# Patient Record
Sex: Female | Born: 1975 | Hispanic: Yes | Marital: Single | Smoking: Current every day smoker
Health system: Southern US, Community
[De-identification: ages and names within clinical notes are randomized; demographics above are authoritative.]

## PROBLEM LIST (undated history)

## (undated) ENCOUNTER — Inpatient Hospital Stay (HOSPITAL_COMMUNITY): Payer: Self-pay

## (undated) DIAGNOSIS — Z789 Other specified health status: Secondary | ICD-10-CM

## (undated) DIAGNOSIS — O24419 Gestational diabetes mellitus in pregnancy, unspecified control: Secondary | ICD-10-CM

## (undated) HISTORY — PX: NO PAST SURGERIES: SHX2092

## (undated) HISTORY — DX: Gestational diabetes mellitus in pregnancy, unspecified control: O24.419

---
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
## 1998-01-17 ENCOUNTER — Other Ambulatory Visit: Admission: RE | Admit: 1998-01-17 | Discharge: 1998-01-17 | Payer: Self-pay | Admitting: Family Medicine

## 1998-06-19 ENCOUNTER — Other Ambulatory Visit: Admission: RE | Admit: 1998-06-19 | Discharge: 1998-06-19 | Payer: Self-pay | Admitting: Obstetrics

## 1998-06-28 ENCOUNTER — Ambulatory Visit (HOSPITAL_COMMUNITY): Admission: RE | Admit: 1998-06-28 | Discharge: 1998-06-28 | Payer: Self-pay | Admitting: Obstetrics

## 1998-12-31 ENCOUNTER — Inpatient Hospital Stay (HOSPITAL_COMMUNITY): Admission: AD | Admit: 1998-12-31 | Discharge: 1998-12-31 | Payer: Self-pay | Admitting: *Deleted

## 1999-01-01 ENCOUNTER — Inpatient Hospital Stay (HOSPITAL_COMMUNITY): Admission: AD | Admit: 1999-01-01 | Discharge: 1999-01-02 | Payer: Self-pay | Admitting: *Deleted

## 2004-08-26 ENCOUNTER — Inpatient Hospital Stay (HOSPITAL_COMMUNITY): Admission: AD | Admit: 2004-08-26 | Discharge: 2004-08-28 | Payer: Self-pay | Admitting: *Deleted

## 2004-08-26 ENCOUNTER — Ambulatory Visit: Payer: Self-pay | Admitting: *Deleted

## 2004-10-29 ENCOUNTER — Other Ambulatory Visit: Admission: RE | Admit: 2004-10-29 | Discharge: 2004-10-29 | Payer: Self-pay | Admitting: Internal Medicine

## 2004-10-29 ENCOUNTER — Ambulatory Visit: Payer: Self-pay | Admitting: Internal Medicine

## 2005-10-13 ENCOUNTER — Other Ambulatory Visit: Admission: RE | Admit: 2005-10-13 | Discharge: 2005-10-13 | Payer: Self-pay | Admitting: Internal Medicine

## 2005-10-13 ENCOUNTER — Encounter: Payer: Self-pay | Admitting: Internal Medicine

## 2005-10-13 ENCOUNTER — Ambulatory Visit: Payer: Self-pay | Admitting: Internal Medicine

## 2008-07-04 ENCOUNTER — Emergency Department (HOSPITAL_COMMUNITY): Admission: EM | Admit: 2008-07-04 | Discharge: 2008-07-05 | Payer: Self-pay | Admitting: Emergency Medicine

## 2009-05-06 ENCOUNTER — Emergency Department (HOSPITAL_COMMUNITY): Admission: EM | Admit: 2009-05-06 | Discharge: 2009-05-06 | Payer: Self-pay | Admitting: Emergency Medicine

## 2009-05-08 ENCOUNTER — Emergency Department (HOSPITAL_COMMUNITY): Admission: EM | Admit: 2009-05-08 | Discharge: 2009-05-08 | Payer: Self-pay | Admitting: Emergency Medicine

## 2009-05-11 ENCOUNTER — Emergency Department (HOSPITAL_COMMUNITY): Admission: EM | Admit: 2009-05-11 | Discharge: 2009-05-11 | Payer: Self-pay | Admitting: Emergency Medicine

## 2009-09-24 ENCOUNTER — Ambulatory Visit (HOSPITAL_COMMUNITY): Admission: RE | Admit: 2009-09-24 | Discharge: 2009-09-24 | Payer: Self-pay | Admitting: Family Medicine

## 2009-09-24 ENCOUNTER — Encounter: Payer: Self-pay | Admitting: Family Medicine

## 2009-10-22 ENCOUNTER — Ambulatory Visit (HOSPITAL_COMMUNITY): Admission: RE | Admit: 2009-10-22 | Discharge: 2009-10-22 | Payer: Self-pay | Admitting: Family Medicine

## 2009-10-22 ENCOUNTER — Encounter: Payer: Self-pay | Admitting: Family Medicine

## 2009-10-29 ENCOUNTER — Ambulatory Visit (HOSPITAL_COMMUNITY): Admission: RE | Admit: 2009-10-29 | Discharge: 2009-10-29 | Payer: Self-pay | Admitting: Family Medicine

## 2009-10-29 ENCOUNTER — Encounter: Payer: Self-pay | Admitting: Family Medicine

## 2009-11-05 ENCOUNTER — Ambulatory Visit (HOSPITAL_COMMUNITY): Admission: RE | Admit: 2009-11-05 | Discharge: 2009-11-05 | Payer: Self-pay | Admitting: Family Medicine

## 2009-11-05 ENCOUNTER — Encounter: Payer: Self-pay | Admitting: Family Medicine

## 2009-11-13 ENCOUNTER — Ambulatory Visit (HOSPITAL_COMMUNITY): Admission: RE | Admit: 2009-11-13 | Discharge: 2009-11-13 | Payer: Self-pay | Admitting: Family Medicine

## 2009-11-13 ENCOUNTER — Encounter: Payer: Self-pay | Admitting: Family Medicine

## 2009-11-20 ENCOUNTER — Ambulatory Visit (HOSPITAL_COMMUNITY): Admission: RE | Admit: 2009-11-20 | Discharge: 2009-11-20 | Payer: Self-pay | Admitting: Family Medicine

## 2009-11-21 ENCOUNTER — Ambulatory Visit: Payer: Self-pay | Admitting: Obstetrics & Gynecology

## 2009-11-25 ENCOUNTER — Ambulatory Visit (HOSPITAL_COMMUNITY): Admission: RE | Admit: 2009-11-25 | Discharge: 2009-11-25 | Payer: Self-pay | Admitting: Family Medicine

## 2009-11-25 ENCOUNTER — Encounter: Payer: Self-pay | Admitting: Family Medicine

## 2009-11-25 ENCOUNTER — Ambulatory Visit: Payer: Self-pay | Admitting: Obstetrics & Gynecology

## 2009-11-25 ENCOUNTER — Inpatient Hospital Stay (HOSPITAL_COMMUNITY): Admission: AD | Admit: 2009-11-25 | Discharge: 2009-11-25 | Payer: Self-pay | Admitting: Obstetrics & Gynecology

## 2009-11-28 ENCOUNTER — Ambulatory Visit: Payer: Self-pay | Admitting: Obstetrics & Gynecology

## 2009-12-03 ENCOUNTER — Encounter: Payer: Self-pay | Admitting: Family Medicine

## 2009-12-03 ENCOUNTER — Ambulatory Visit (HOSPITAL_COMMUNITY): Admission: RE | Admit: 2009-12-03 | Discharge: 2009-12-03 | Payer: Self-pay | Admitting: Family Medicine

## 2009-12-05 ENCOUNTER — Ambulatory Visit: Payer: Self-pay | Admitting: Obstetrics & Gynecology

## 2009-12-10 ENCOUNTER — Ambulatory Visit (HOSPITAL_COMMUNITY): Admission: RE | Admit: 2009-12-10 | Discharge: 2009-12-10 | Payer: Self-pay | Admitting: Family Medicine

## 2009-12-10 ENCOUNTER — Encounter: Payer: Self-pay | Admitting: Family Medicine

## 2009-12-12 ENCOUNTER — Ambulatory Visit: Payer: Self-pay | Admitting: Obstetrics & Gynecology

## 2009-12-16 ENCOUNTER — Ambulatory Visit (HOSPITAL_COMMUNITY): Admission: RE | Admit: 2009-12-16 | Discharge: 2009-12-16 | Payer: Self-pay | Admitting: Family Medicine

## 2009-12-19 ENCOUNTER — Ambulatory Visit: Payer: Self-pay | Admitting: Family Medicine

## 2009-12-19 ENCOUNTER — Encounter (INDEPENDENT_AMBULATORY_CARE_PROVIDER_SITE_OTHER): Payer: Self-pay | Admitting: *Deleted

## 2009-12-22 ENCOUNTER — Encounter: Payer: Self-pay | Admitting: Family Medicine

## 2009-12-22 ENCOUNTER — Ambulatory Visit: Payer: Self-pay | Admitting: Obstetrics and Gynecology

## 2009-12-22 ENCOUNTER — Inpatient Hospital Stay (HOSPITAL_COMMUNITY): Admission: AD | Admit: 2009-12-22 | Discharge: 2009-12-24 | Payer: Self-pay | Admitting: Family Medicine

## 2010-01-15 ENCOUNTER — Encounter: Payer: Self-pay | Admitting: Family Medicine

## 2010-01-15 ENCOUNTER — Ambulatory Visit: Payer: Self-pay | Admitting: Family Medicine

## 2010-01-15 LAB — CONVERTED CEMR LAB: Beta hcg, urine, semiquantitative: NEGATIVE

## 2010-08-17 ENCOUNTER — Encounter: Payer: Self-pay | Admitting: Family Medicine

## 2010-08-28 NOTE — Assessment & Plan Note (Signed)
Summary: PPD ck/Implanon imp,tcb   Vital Signs:  Patient profile:   35 year old female Weight:      176 pounds Temp:     98.6 degrees F Pulse rate:   84 / minute BP sitting:   121 / 76  Vitals Entered By: Jone Baseman CMA (January 15, 2010 2:07 PM) CC: Implanon Is Patient Diabetic? No Pain Assessment Patient in pain? no        CC:  Implanon.  History of Present IllnessChristiana Vincent, [id number], s/p VD on 12-22-09.  Pt requested Implanon prior to deliery of VMI on 12-22-09.  Pt denies intercorse since delivery, no bleeding currently. Pt denies every having had serious blood clots, a heart attack or stroke. never had hepatic tumors or liver disease, no hx of undiagnosed abnormal genital bleeding, no hx of suspected breast cancer or a history of breast cancer.  Pt is not breast-feeding.   No allergies.     Habits & Providers  Alcohol-Tobacco-Diet     Alcohol drinks/day: 0     Tobacco Status: quit     Tobacco Counseling: to quit use of tobacco products     Cigarette Packs/Day: <0.25  Exercise-Depression-Behavior     Does Patient Exercise: yes     Drug Use: no  Allergies (verified): No Known Drug Allergies  Past History:  Past Medical History: Unremarkable  Past Surgical History: Denies surgical history  Family History: Reviewed history and no changes required.  Social History: Married Former Smoker Alcohol use-no Drug use-no Regular exercise-yes Smoking Status:  quit Packs/Day:  <0.25 Drug Use:  no Does Patient Exercise:  yes  Physical Exam  General:  VS reviewed, alert, well-developed, and well-nourished.   Lungs:  Normal respiratory effort, chest expands symmetrically. Lungs are clear to auscultation, no crackles or wheezes. Heart:  Normal rate and regular rhythm. S1 and S2 normal without gallop, murmur, click, rub or other extra sounds. Abdomen:  Bowel sounds positive,abdomen soft and non-tender without masses, organomegaly or hernias noted. Additional  Exam:  Procedure Note: Pt supine with Left arm exposed and supported by the table. Inspected upper inner aspect of her left (non dominant) arm , insertion site identified and marked (approximately three fingerbreadths superior and lateral to the medial epicondyle of the humerus).  Sterile drape under the arm and insertion site cleaned with iodine swabs x 2. 25-gauge needle attached to 5 mL syringe was used to administer 5 cc's of  1% lidocaine which was injected into the dermis to raise a wheal along the planned track of the rod insertion needle  Confirmed that the rod was in the needle and Implanon was inserted, bevel up. Advanced needle in subdermal connective tissue while tenting up the skin to ensure proper placement. Needle was advanced to its full length, seal was broken on the applicator, turned obturator 90 degrees. Slowly removed the cannula and needle out of  arm, rod under the skin.   Skin was palpated and both ends of rod were palpable, verified correct placement of the rod; Examine the needle, obturator visible, rod not present. Showed  patient how to palpate the rod,  then placed an adhesive closure and bandage over the insertion site. Patient Chart Label:  Lot # [id number]. Insertion date: 01-15-10; 3 year removal date: 01-15-2013. LEFT arm. Implanon was successfully placed. User Card filled out and  given to the patient.  Pt tolerated procedure well and had no complications. Advised to call if she develops pain, discharge, or swelling at the  insertion site, or fever.     Impression & Recommendations:  Problem # 1:  CONTRACEPTIVE MANAGEMENT (ICD-V25.09) Assessment Unchanged U preg negative.  Reviewed risks of procedures with pt.  Discussed possible side effects including, but not limited to, irregular/unpredicitable peroids, mood swings, headaches, weight gain, acne.  Cosent form reviwed and signed. Dr. Jennette Kettle supervised.   Pt Tolerated procedure well, no complications.  Advised no  intercourse x 7 days.   See procedure note above for full details.   Complete Medication List: 1)  Prenatal/folic Acid Tabs (Prenatal vit-fe fumarate-fa)  Other Orders: U Preg-[facility] (16109) Insertion, non biodegradable drug deliver implant, (60454) Etonogestrel implant system, including implant and supplie (U9811) Postpartum visit- [facility] ([id number])  Patient Instructions: 1)  Your Implanon was placed in your left arm today. 2)  Please call me if you have any pain, swelling or fevers. 3)  You can have intercourse in 7 days. 4)  Implanon does not protect you from STD's. 5)  Keep your Implanon card in a safe place.   Laboratory Results   Urine Tests  Date/Time Received: January 15, 2010 2:10 PM  Date/Time Reported: January 15, 2010 2:15 PM     Urine HCG: negative Comments: ...........test performed by...........Marland KitchenTerese Door, CMA

## 2010-08-28 NOTE — Miscellaneous (Signed)
Summary: Consent Implanon Insertion  Consent Implanon Insertion   Imported By: Clydell Hakim 01/23/2010 12:11:54  _____________________________________________________________________  External Attachment:    Type:   Image     Comment:   External Document

## 2010-10-13 LAB — POCT URINALYSIS DIP (DEVICE)
Bilirubin Urine: NEGATIVE
Glucose, UA: NEGATIVE mg/dL
Glucose, UA: NEGATIVE mg/dL
Hgb urine dipstick: NEGATIVE
Ketones, ur: NEGATIVE mg/dL
Nitrite: NEGATIVE
Protein, ur: NEGATIVE mg/dL
Protein, ur: NEGATIVE mg/dL
Specific Gravity, Urine: >=1.03 (ref 1.005–1.030)
Urobilinogen, UA: 0.2 mg/dL (ref 0.0–1.0)
Urobilinogen, UA: 0.2 mg/dL (ref 0.0–1.0)
pH: 6.5 (ref 5.0–8.0)

## 2010-10-13 LAB — RPR: RPR Ser Ql: NONREACTIVE

## 2010-10-13 LAB — CBC
HCT: 36.6 % (ref 36.0–46.0)
MCHC: 34.7 g/dL (ref 30.0–36.0)
MCV: 91.6 fL (ref 78.0–100.0)
MCV: 92.1 fL (ref 78.0–100.0)
Platelets: 242 10*3/uL (ref 150–400)
RBC: 4 MIL/uL (ref 3.87–5.11)
RDW: 13.5 % (ref 11.5–15.5)
RDW: 13.8 % (ref 11.5–15.5)
WBC: 10.8 10*3/uL — ABNORMAL HIGH (ref 4.0–10.5)

## 2010-10-14 LAB — POCT URINALYSIS DIP (DEVICE)
Bilirubin Urine: NEGATIVE
Glucose, UA: NEGATIVE mg/dL
Glucose, UA: NEGATIVE mg/dL
Hgb urine dipstick: NEGATIVE
Ketones, ur: NEGATIVE mg/dL
Ketones, ur: NEGATIVE mg/dL
Protein, ur: NEGATIVE mg/dL
Specific Gravity, Urine: 1.015 (ref 1.005–1.030)
Urobilinogen, UA: 0.2 mg/dL (ref 0.0–1.0)
Urobilinogen, UA: 0.2 mg/dL (ref 0.0–1.0)
Urobilinogen, UA: 0.2 mg/dL (ref 0.0–1.0)
pH: 6.5 (ref 5.0–8.0)

## 2011-05-01 LAB — CULTURE, ROUTINE-ABSCESS

## 2011-05-01 LAB — POCT PREGNANCY, URINE: Preg Test, Ur: NEGATIVE

## 2011-05-18 ENCOUNTER — Emergency Department (HOSPITAL_COMMUNITY)
Admission: EM | Admit: 2011-05-18 | Discharge: 2011-05-19 | Disposition: A | Discharge status: Home / Self Care | Payer: Self-pay | Attending: Emergency Medicine | Admitting: Emergency Medicine

## 2011-05-18 DIAGNOSIS — R109 Unspecified abdominal pain: Secondary | ICD-10-CM | POA: Insufficient documentation

## 2011-05-18 DIAGNOSIS — R10815 Periumbilic abdominal tenderness: Secondary | ICD-10-CM | POA: Insufficient documentation

## 2011-05-18 DIAGNOSIS — R112 Nausea with vomiting, unspecified: Secondary | ICD-10-CM | POA: Insufficient documentation

## 2011-05-18 LAB — URINALYSIS, ROUTINE W REFLEX MICROSCOPIC
Bilirubin Urine: NEGATIVE
Hgb urine dipstick: NEGATIVE
Ketones, ur: NEGATIVE mg/dL
Nitrite: NEGATIVE
Urobilinogen, UA: 1 mg/dL (ref 0.0–1.0)

## 2011-05-18 LAB — BASIC METABOLIC PANEL
CO2: 22 mEq/L (ref 19–32)
Calcium: 9.2 mg/dL (ref 8.4–10.5)
Creatinine, Ser: 0.5 mg/dL (ref 0.50–1.10)
GFR calc non Af Amer: >90 mL/min (ref >90)
Sodium: 139 mEq/L (ref 135–145)

## 2011-05-18 LAB — DIFFERENTIAL
Basophils Absolute: 0 10*3/uL (ref 0.0–0.1)
Lymphocytes Relative: 25 % (ref 12–46)
Lymphs Abs: 3.5 10*3/uL (ref 0.7–4.0)
Neutro Abs: 9.4 10*3/uL — ABNORMAL HIGH (ref 1.7–7.7)

## 2011-05-18 LAB — CBC
Hemoglobin: 14 g/dL (ref 12.0–15.0)
MCV: 89.9 fL (ref 78.0–100.0)
Platelets: 278 10*3/uL (ref 150–400)
WBC: 14 10*3/uL — ABNORMAL HIGH (ref 4.0–10.5)

## 2011-05-18 LAB — POCT PREGNANCY, URINE: Preg Test, Ur: NEGATIVE

## 2011-05-19 ENCOUNTER — Emergency Department (HOSPITAL_COMMUNITY): Payer: Self-pay

## 2011-05-19 LAB — HEPATIC FUNCTION PANEL
Alkaline Phosphatase: 64 U/L (ref 39–117)
Bilirubin, Direct: 0.1 mg/dL (ref 0.0–0.3)
Total Bilirubin: 0.9 mg/dL (ref 0.3–1.2)

## 2011-05-19 LAB — LIPASE, BLOOD: Lipase: 17 U/L (ref 11–59)

## 2011-05-19 MED ORDER — IOHEXOL 300 MG/ML  SOLN
80.0000 mL | Freq: Once | INTRAMUSCULAR | Status: AC | PRN
  Administered 2011-05-19: 80 mL via INTRAVENOUS

## 2011-06-01 ENCOUNTER — Emergency Department (HOSPITAL_COMMUNITY)
Admission: EM | Admit: 2011-06-01 | Discharge: 2011-06-01 | Discharge status: Against Medical Advice | Payer: Self-pay | Attending: Emergency Medicine | Admitting: Emergency Medicine

## 2011-06-01 ENCOUNTER — Encounter: Payer: Self-pay | Admitting: *Deleted

## 2011-06-01 DIAGNOSIS — K089 Disorder of teeth and supporting structures, unspecified: Secondary | ICD-10-CM | POA: Insufficient documentation

## 2011-06-01 NOTE — ED Notes (Signed)
Toothache since this afternoon

## 2011-11-13 ENCOUNTER — Encounter (HOSPITAL_COMMUNITY): Payer: Self-pay | Admitting: *Deleted

## 2011-11-13 ENCOUNTER — Emergency Department (HOSPITAL_COMMUNITY)
Admission: EM | Admit: 2011-11-13 | Discharge: 2011-11-13 | Disposition: A | Discharge status: Home / Self Care | Payer: Self-pay | Attending: Emergency Medicine | Admitting: Emergency Medicine

## 2011-11-13 DIAGNOSIS — K0889 Other specified disorders of teeth and supporting structures: Secondary | ICD-10-CM

## 2011-11-13 DIAGNOSIS — F17200 Nicotine dependence, unspecified, uncomplicated: Secondary | ICD-10-CM | POA: Insufficient documentation

## 2011-11-13 DIAGNOSIS — K089 Disorder of teeth and supporting structures, unspecified: Secondary | ICD-10-CM | POA: Insufficient documentation

## 2011-11-13 LAB — SALICYLATE LEVEL: Salicylate Lvl: 10.2 mg/dL (ref 2.8–20.0)

## 2011-11-13 MED ORDER — NAPROXEN 500 MG PO TABS: 500.0000 mg | ORAL_TABLET | Freq: Two times a day (BID) | ORAL | Status: DC

## 2011-11-13 MED ORDER — MORPHINE SULFATE 4 MG/ML IJ SOLN: 4.0000 mg | Freq: Once | INTRAMUSCULAR | Status: DC

## 2011-11-13 MED ORDER — MORPHINE SULFATE 4 MG/ML IJ SOLN
4.0000 mg | Freq: Once | INTRAMUSCULAR | Status: AC
  Administered 2011-11-13: 4 mg via INTRAMUSCULAR
  Filled 2011-11-13: qty 1

## 2011-11-13 MED ORDER — PENICILLIN V POTASSIUM 500 MG PO TABS: 500.0000 mg | ORAL_TABLET | Freq: Four times a day (QID) | ORAL | Status: AC

## 2011-11-13 MED ORDER — HYDROCODONE-ACETAMINOPHEN 5-500 MG PO TABS
1.0000 | ORAL_TABLET | Freq: Four times a day (QID) | ORAL | Status: AC | PRN
Start: 2011-11-13 — End: 2011-11-23

## 2011-11-13 NOTE — ED Notes (Signed)
Pt reports a filling on bottom left molar came out approx 2 weeks ago, also reports pain to rt upper molar

## 2011-11-13 NOTE — Discharge Instructions (Signed)
Dental Pain A tooth ache may be caused by cavities (tooth decay). Cavities expose the nerve of the tooth to air and hot or cold temperatures. It may come from an infection or abscess (also called a boil or furuncle) around your tooth. It is also often caused by dental caries (tooth decay). This causes the pain you are having. DIAGNOSIS  Your caregiver can diagnose this problem by exam. TREATMENT   If caused by an infection, it may be treated with medications which kill germs (antibiotics) and pain medications as prescribed by your caregiver. Take medications as directed.   Only take over-the-counter or prescription medicines for pain, discomfort, or fever as directed by your caregiver.   Whether the tooth ache today is caused by infection or dental disease, you should see your dentist as soon as possible for further care.  SEEK MEDICAL CARE IF: The exam and treatment you received today has been provided on an emergency basis only. This is not a substitute for complete medical or dental care. If your problem worsens or new problems (symptoms) appear, and you are unable to meet with your dentist, call or return to this location. SEEK IMMEDIATE MEDICAL CARE IF:   You have a fever.   You develop redness and swelling of your face, jaw, or neck.   You are unable to open your mouth.   You have severe pain uncontrolled by pain medicine.  MAKE SURE YOU:   Understand these instructions.   Will watch your condition.   Will get help right away if you are not doing well or get worse.  Document Released: 07/13/2005 Document Revised: 07/02/2011 Document Reviewed: 02/29/2008 ExitCare Patient Information 2012 [employer]  RESOURCE GUIDE  Dental Problems  Patients with Medicaid: [facility]                     [facility] [address]                                           [address] Phone:  [phone]                                                   Phone:  [phone]  If unable to pay or uninsured, contact:  [facility] or [facility] to become qualified for the adult dental clinic.  Chronic Pain Problems Contact [facility]  [phone] Patients need to be referred by their primary care doctor.  Insufficient Money for Medicine Contact [employer]:  call "211" or [facility] 271-5999.  No Primary Care Doctor Call [facility]  832-8000 Other agencies that provide inexpensive medical care    [facility]  [phone]    [facility]  [phone]    [facility]  [phone]    [facility] Clinic  [phone]    [facility]  [phone]    [facility]  [phone]  Psychological Services [facility]  [phone] [facility]  [phone] [facility]   800 853-5163 (emergency services 641-4993)  Substance Abuse Resources [facility]  336-882-2125 [facility] 336-784-9470 [facility] 336-285-9073 [facility]   336-845-3988 Residential & Outpatient Substance Abuse Program  800-659-3381  Abuse/Neglect [place] Child Abuse Hotline (336) 641-3795 [place] [facility] Abuse Hotline 800-378-5315 (After Hours)  Emergency Shelter [facility] (336) 271-5985  M[facility] (336) 275-9566 [facility] (704) 372-4663  MRSA Hotline #:   832-7006    [place] Resources  [facility] [place]     [employer]                          [facility] [address] [place]                       [address]      [address]  [place]                                                [place]                            [place] Phone:  [phone]                                   Phone:  [phone]                 Phone:  [phone]  [facility] Phone:   [phone]  [place] Child Abuse Hotline (336) 342-1394 (336) 342-3537 (After Hours)   

## 2011-11-13 NOTE — ED Notes (Signed)
Patient states she took a total of seven (7) Bayer Aspirin (200mg ) between 21:00 and arrival to ED.  Took them a couple at a time and tried to go to sleep, throbbing pain left side of face and into neck, took more until she finally came to ED.  Denies taking any Ibuprofen or tylenol tonight.  States she took ibuprofen this morning around 05:00

## 2011-11-13 NOTE — ED Provider Notes (Addendum)
History     CSN: [id number]  Arrival date & time 11/13/11  0014   First MD Initiated Contact with Patient 11/13/11 [phone]      Chief Complaint  Patient presents with  . Dental Pain    (Consider location/radiation/quality/duration/timing/severity/associated sxs/prior treatment) HPI Comments: Pt has had toothache X 2 weeks. Has had persistent moderate toothache without swelling, vomiting or nausea.  She has no f/c.  Sx are worse with chewing or temperature changes.  She took 9 ASA tablets and several tylenol tabs.  The history is provided by the patient and the spouse.    History reviewed. No pertinent past medical history.  History reviewed. No pertinent past surgical history.  No family history on file.  History  Substance Use Topics  . Smoking status: Current Everyday Smoker  . Smokeless tobacco: Not on file  . Alcohol Use: No    OB History    Grav Para Term Preterm Abortions TAB SAB Ect Mult Living                  Review of Systems  All other systems reviewed and are negative.    Allergies  Review of patient's allergies indicates no known allergies.  Home Medications   Current Outpatient Rx  Name Route Sig Dispense Refill  . ACETAMINOPHEN 500 MG PO TABS Oral Take 500-1,000 mg by mouth 3 (three) times daily as needed. For pain     . HYDROCODONE-ACETAMINOPHEN 5-500 MG PO TABS Oral Take 1-2 tablets by mouth every 6 (six) hours as needed for pain. 15 tablet 0  . NAPROXEN 500 MG PO TABS Oral Take 1 tablet (500 mg total) by mouth 2 (two) times daily with a meal. 30 tablet 0  . PENICILLIN V POTASSIUM 500 MG PO TABS Oral Take 1 tablet (500 mg total) by mouth 4 (four) times daily. 40 tablet 0    BP 123/80  Pulse 82  Temp(Src) 98.1 F (36.7 C) (Oral)  Resp 20  Wt 162 lb (73.483 kg)  SpO2 99%  LMP 09/24/2011  Physical Exam  Nursing note and vitals reviewed. Constitutional: She appears well-developed and well-nourished. No distress.  HENT:  Head:  Normocephalic and atraumatic.  Mouth/Throat: Oropharynx is clear and moist. No oropharyngeal exudate.       Left lower first and second molar with missing fillings, no signs of periodontal swelling erythema abscess induration. No tenderness in the sublingual area, no external swelling of the jaw, no trismus  Eyes: Conjunctivae and EOM are normal. Pupils are equal, round, and reactive to light. Right eye exhibits no discharge. Left eye exhibits no discharge. No scleral icterus.  Neck: Normal range of motion. Neck supple. No JVD present. No thyromegaly present.  Cardiovascular: Normal rate, regular rhythm, normal heart sounds and intact distal pulses.  Exam reveals no gallop and no friction rub.   No murmur heard. Pulmonary/Chest: Effort normal and breath sounds normal. No respiratory distress. She has no wheezes. She has no rales.  Abdominal: Soft. Bowel sounds are normal. She exhibits no distension. There is no tenderness.  Musculoskeletal: Normal range of motion. She exhibits no edema and no tenderness.  Lymphadenopathy:    She has no cervical adenopathy.  Neurological: She is alert. Coordination normal.  Skin: Skin is warm and dry. No rash noted. No erythema.  Psychiatric: She has a normal mood and affect. Her behavior is normal.    ED Course  Procedures (including critical care time)   Labs Reviewed  SALICYLATE LEVEL  ACETAMINOPHEN LEVEL   No results found.   1. Toothache       MDM  Discussed care with poison control, who agrees with taking initial aspirin and Tylenol levels, repeat levels as needed for level > 20, admit > 40. Intermuscular morphine given, will discharge with antibiotics assuming no need for toxicologic treatment.   Salicylate level X, acetaminophen undetectable, vital signs stable, pain medication given including intramuscular morphine, will discharge him with antibiotics and pain medications with dental followup. Resource list given   I have counseled the  patient extensively about using too much medication that is over-the-counter. She has expressed her understanding and will follow the guidelines on the bottle.  Discharge Prescriptions include:  Hydrocodone Penicillin Naprosyn  Vida Roller, MD 11/13/11 0130  Vida Roller, MD 11/13/11 [phone]

## 2012-09-12 ENCOUNTER — Emergency Department (HOSPITAL_COMMUNITY)
Admission: EM | Admit: 2012-09-12 | Discharge: 2012-09-12 | Disposition: A | Discharge status: Home / Self Care | Payer: Self-pay | Attending: Emergency Medicine | Admitting: Emergency Medicine

## 2012-09-12 ENCOUNTER — Encounter (HOSPITAL_COMMUNITY): Payer: Self-pay | Admitting: *Deleted

## 2012-09-12 ENCOUNTER — Emergency Department (HOSPITAL_COMMUNITY): Payer: Self-pay

## 2012-09-12 DIAGNOSIS — R110 Nausea: Secondary | ICD-10-CM | POA: Insufficient documentation

## 2012-09-12 DIAGNOSIS — R5381 Other malaise: Secondary | ICD-10-CM | POA: Insufficient documentation

## 2012-09-12 DIAGNOSIS — F17200 Nicotine dependence, unspecified, uncomplicated: Secondary | ICD-10-CM | POA: Insufficient documentation

## 2012-09-12 DIAGNOSIS — R51 Headache: Secondary | ICD-10-CM | POA: Insufficient documentation

## 2012-09-12 DIAGNOSIS — H81399 Other peripheral vertigo, unspecified ear: Secondary | ICD-10-CM | POA: Insufficient documentation

## 2012-09-12 DIAGNOSIS — Z3202 Encounter for pregnancy test, result negative: Secondary | ICD-10-CM | POA: Insufficient documentation

## 2012-09-12 LAB — URINALYSIS, ROUTINE W REFLEX MICROSCOPIC
Bilirubin Urine: NEGATIVE
Glucose, UA: NEGATIVE mg/dL
Hgb urine dipstick: NEGATIVE
Protein, ur: NEGATIVE mg/dL
Specific Gravity, Urine: 1.015 (ref 1.005–1.030)
Urobilinogen, UA: 0.2 mg/dL (ref 0.0–1.0)

## 2012-09-12 LAB — POCT I-STAT, CHEM 8
BUN: 4 mg/dL — ABNORMAL LOW (ref 6–23)
Chloride: 105 mEq/L (ref 96–112)
Creatinine, Ser: 0.7 mg/dL (ref 0.50–1.10)
Sodium: 140 mEq/L (ref 135–145)
TCO2: 26 mmol/L (ref 0–100)

## 2012-09-12 LAB — URINE MICROSCOPIC-ADD ON

## 2012-09-12 LAB — PREGNANCY, URINE: Preg Test, Ur: NEGATIVE

## 2012-09-12 MED ORDER — MECLIZINE HCL 25 MG PO TABS: 25.0000 mg | ORAL_TABLET | Freq: Three times a day (TID) | ORAL | Status: DC | PRN

## 2012-09-12 MED ORDER — ONDANSETRON 8 MG PO TBDP
8.0000 mg | ORAL_TABLET | Freq: Once | ORAL | Status: AC | PRN
  Administered 2012-09-12: 8 mg via ORAL
  Filled 2012-09-12: qty 1

## 2012-09-12 MED ORDER — ONDANSETRON HCL 4 MG PO TABS: 4.0000 mg | ORAL_TABLET | Freq: Three times a day (TID) | ORAL | Status: DC | PRN

## 2012-09-12 MED ORDER — MECLIZINE HCL 12.5 MG PO TABS
25.0000 mg | ORAL_TABLET | Freq: Once | ORAL | Status: AC
Start: 2012-09-12 — End: 2012-09-12
  Administered 2012-09-12: 25 mg via ORAL
  Filled 2012-09-12: qty 2

## 2012-09-12 NOTE — ED Notes (Signed)
Discharge instructions reviewed with pt, questions answered. Pt verbalized understanding.  

## 2012-09-12 NOTE — ED Provider Notes (Signed)
History     CSN: [id number]  Arrival date & time 09/12/12  1919   First MD Initiated Contact with Patient 09/12/12 1929      Chief Complaint  Patient presents with  . Dizziness     HPI Pt was seen at 1945.   Per pt, c/o gradual onset and persistence of multiple intermittent episodes of "dizziness" that began yesterday.  Pt describes the dizziness as "everything is moving" and "spinning."  Has been associated with nausea and generalized fatigue.  Symptoms worsen when she turns her head or eyes side to side.  Denies vomiting/diarrhea, no abd pain, no CP/SOB, no neck pain, no visual changes, no focal motor weakness, no tingling/numbness in extremities.       History reviewed. No pertinent past medical history.  History reviewed. No pertinent past surgical history.   History  Substance Use Topics  . Smoking status: Current Every Day Smoker  . Smokeless tobacco: Not on file  . Alcohol Use: No    Review of Systems ROS: Statement: All systems negative except as marked or noted in the HPI; Constitutional: Negative for fever and chills. +fatigue. ; ; Eyes: Negative for eye pain, redness and discharge. ; ; ENMT: Negative for ear pain, hoarseness, nasal congestion, sinus pressure and sore throat. ; ; Cardiovascular: Negative for chest pain, palpitations, diaphoresis, dyspnea and peripheral edema. ; ; Respiratory: Negative for cough, wheezing and stridor. ; ; Gastrointestinal: +nausea. Negative for vomiting, diarrhea, abdominal pain, blood in stool, hematemesis, jaundice and rectal bleeding. . ; ; Genitourinary: Negative for dysuria, flank pain and hematuria. ; ; Musculoskeletal: Negative for back pain and neck pain. Negative for swelling and trauma.; ; Skin: Negative for pruritus, rash, abrasions, blisters, bruising and skin lesion.; ; Neuro: +"dizziness." Negative for headache, lightheadedness and neck stiffness. Negative for weakness, altered level of consciousness , altered mental status,  extremity weakness, paresthesias, involuntary movement, seizure and syncope.       Allergies  Review of patient's allergies indicates no known allergies.  Home Medications   Current Outpatient Rx  Name  Route  Sig  Dispense  Refill  . acetaminophen (TYLENOL) 500 MG tablet   Oral   Take 500-1,000 mg by mouth daily as needed (headache pain). For pain           BP 125/77  Pulse 88  Temp(Src) 98.4 F (36.9 C) (Oral)  Resp 20  Ht 5\' 4"  (1.626 m)  Wt 167 lb (75.751 kg)  BMI 28.65 kg/m2  SpO2 100%  LMP 07/28/2012  Physical Exam 1950: Physical examination:  Nursing notes reviewed; Vital signs and O2 SAT reviewed;  Constitutional: Well developed, Well nourished, Well hydrated, In no acute distress; Head:  Normocephalic, atraumatic; Eyes: EOMI, PERRL, No scleral icterus; ENMT: Mouth and pharynx normal, Mucous membranes moist; Neck: Supple, Full range of motion, No lymphadenopathy; Cardiovascular: Regular rate and rhythm, No murmur, rub, or gallop; Respiratory: Breath sounds clear & equal bilaterally, No rales, rhonchi, wheezes.  Speaking full sentences with ease, Normal respiratory effort/excursion; Chest: Nontender, Movement normal; Abdomen: Soft, Nontender, Nondistended, Normal bowel sounds; Genitourinary: No CVA tenderness; Extremities: Pulses normal, No tenderness, No edema, No calf edema or asymmetry.; Neuro: AA&Ox3, Major CN grossly intact.  Strength 5/5 equal bilat UE's and LE's.  DTR 2/4 equal bilat UE's and LE's.  No gross sensory deficits.  Normal cerebellar testing bilat UE's (finger-nose) and LE's (heel-shin). Gait steady. Speech clear.  No facial droop. +right horizontal gaze fatigable nystagmus which reproduces pt's symptoms; Skin:  Color normal, Warm, Dry.   ED Course  Procedures    MDM  MDM Reviewed: previous chart, nursing note and vitals Interpretation: ECG, labs and CT scan    Date: 09/12/2012  Rate: 66  Rhythm: normal sinus rhythm  QRS Axis: normal   Intervals: normal  ST/T Wave abnormalities: normal  Conduction Disutrbances:none  Narrative Interpretation:   Old EKG Reviewed: none available.   Results for orders placed during the hospital encounter of 09/12/12  URINALYSIS, ROUTINE W REFLEX MICROSCOPIC      Result Value Range   Color, Urine YELLOW  YELLOW   APPearance CLEAR  CLEAR   Specific Gravity, Urine 1.015  1.005 - 1.030   pH 7.5  5.0 - 8.0   Glucose, UA NEGATIVE  NEGATIVE mg/dL   Hgb urine dipstick NEGATIVE  NEGATIVE   Bilirubin Urine NEGATIVE  NEGATIVE   Ketones, ur NEGATIVE  NEGATIVE mg/dL   Protein, ur NEGATIVE  NEGATIVE mg/dL   Urobilinogen, UA 0.2  0.0 - 1.0 mg/dL   Nitrite NEGATIVE  NEGATIVE   Leukocytes, UA SMALL (*) NEGATIVE  PREGNANCY, URINE      Result Value Range   Preg Test, Ur NEGATIVE  NEGATIVE  URINE MICROSCOPIC-ADD ON      Result Value Range   Squamous Epithelial / LPF FEW (*) RARE   WBC, UA 0-2  <3 WBC/hpf   Bacteria, UA FEW (*) RARE  POCT I-STAT, CHEM 8      Result Value Range   Sodium 140  135 - 145 mEq/L   Potassium 3.5  3.5 - 5.1 mEq/L   Chloride 105  96 - 112 mEq/L   BUN 4 (*) 6 - 23 mg/dL   Creatinine, Ser 1.61  0.50 - 1.10 mg/dL   Glucose, Bld 87  70 - 99 mg/dL   Calcium, Ion 0.96  0.45 - 1.23 mmol/L   TCO2 26  0 - 100 mmol/L   Hemoglobin 15.0  12.0 - 15.0 g/dL   HCT 40.9  81.1 - 91.4 %     2200:  Pt is not orthostatic on VS.  Able to walk around the ED with steady gait, easy resps.  Feels better and wants to go home now. Dx and testing d/w pt.  Questions answered.  Verb understanding, agreeable to d/c home with outpt f/u.            Laray Anger, DO 09/15/12 1106

## 2012-09-12 NOTE — ED Notes (Signed)
Dizzy and "feels tired"  Headache. Onset yesterday.NO HI.  Alert, talking, nl gait.

## 2013-09-01 ENCOUNTER — Encounter (HOSPITAL_BASED_OUTPATIENT_CLINIC_OR_DEPARTMENT_OTHER): Payer: Self-pay | Admitting: Emergency Medicine

## 2013-09-01 ENCOUNTER — Emergency Department (HOSPITAL_BASED_OUTPATIENT_CLINIC_OR_DEPARTMENT_OTHER)
Admission: EM | Admit: 2013-09-01 | Discharge: 2013-09-01 | Disposition: A | Discharge status: Home / Self Care | Payer: Self-pay | Attending: Emergency Medicine | Admitting: Emergency Medicine

## 2013-09-01 ENCOUNTER — Emergency Department (HOSPITAL_BASED_OUTPATIENT_CLINIC_OR_DEPARTMENT_OTHER): Payer: Self-pay

## 2013-09-01 DIAGNOSIS — F17200 Nicotine dependence, unspecified, uncomplicated: Secondary | ICD-10-CM | POA: Insufficient documentation

## 2013-09-01 DIAGNOSIS — R109 Unspecified abdominal pain: Secondary | ICD-10-CM

## 2013-09-01 DIAGNOSIS — R1011 Right upper quadrant pain: Secondary | ICD-10-CM | POA: Insufficient documentation

## 2013-09-01 DIAGNOSIS — R630 Anorexia: Secondary | ICD-10-CM | POA: Insufficient documentation

## 2013-09-01 DIAGNOSIS — R1013 Epigastric pain: Secondary | ICD-10-CM | POA: Insufficient documentation

## 2013-09-01 DIAGNOSIS — R300 Dysuria: Secondary | ICD-10-CM | POA: Insufficient documentation

## 2013-09-01 DIAGNOSIS — R1031 Right lower quadrant pain: Secondary | ICD-10-CM | POA: Insufficient documentation

## 2013-09-01 DIAGNOSIS — Z3202 Encounter for pregnancy test, result negative: Secondary | ICD-10-CM | POA: Insufficient documentation

## 2013-09-01 LAB — URINALYSIS, ROUTINE W REFLEX MICROSCOPIC
Glucose, UA: NEGATIVE mg/dL
HGB URINE DIPSTICK: NEGATIVE
Ketones, ur: NEGATIVE mg/dL
Leukocytes, UA: NEGATIVE
Nitrite: NEGATIVE
PH: 6 (ref 5.0–8.0)
Protein, ur: NEGATIVE mg/dL
SPECIFIC GRAVITY, URINE: 1.028 (ref 1.005–1.030)
Urobilinogen, UA: 0.2 mg/dL (ref 0.0–1.0)

## 2013-09-01 LAB — COMPREHENSIVE METABOLIC PANEL
ALBUMIN: 4.2 g/dL (ref 3.5–5.2)
ALT: 12 U/L (ref 0–35)
AST: 16 U/L (ref 0–37)
Alkaline Phosphatase: 69 U/L (ref 39–117)
BUN: 8 mg/dL (ref 6–23)
CO2: 24 mEq/L (ref 19–32)
CREATININE: 0.6 mg/dL (ref 0.50–1.10)
Calcium: 9.3 mg/dL (ref 8.4–10.5)
Chloride: 102 mEq/L (ref 96–112)
GFR calc Af Amer: >90 mL/min (ref >90)
GFR calc non Af Amer: >90 mL/min (ref >90)
Glucose, Bld: 138 mg/dL — ABNORMAL HIGH (ref 70–99)
Potassium: 3.7 mEq/L (ref 3.7–5.3)
Sodium: 139 mEq/L (ref 137–147)
Total Bilirubin: 1 mg/dL (ref 0.3–1.2)
Total Protein: 7.5 g/dL (ref 6.0–8.3)

## 2013-09-01 LAB — CBC WITH DIFFERENTIAL/PLATELET
BASOS ABS: 0 10*3/uL (ref 0.0–0.1)
BASOS PCT: 0 % (ref 0–1)
EOS PCT: 3 % (ref 0–5)
Eosinophils Absolute: 0.4 10*3/uL (ref 0.0–0.7)
HEMATOCRIT: 41.9 % (ref 36.0–46.0)
Hemoglobin: 14.2 g/dL (ref 12.0–15.0)
Lymphocytes Relative: 41 % (ref 12–46)
Lymphs Abs: 5.3 10*3/uL — ABNORMAL HIGH (ref 0.7–4.0)
MCH: 31.3 pg (ref 26.0–34.0)
MCHC: 33.9 g/dL (ref 30.0–36.0)
MCV: 92.5 fL (ref 78.0–100.0)
MONO ABS: 1 10*3/uL (ref 0.1–1.0)
Monocytes Relative: 8 % (ref 3–12)
Neutro Abs: 6.1 10*3/uL (ref 1.7–7.7)
Neutrophils Relative %: 48 % (ref 43–77)
Platelets: 296 10*3/uL (ref 150–400)
RBC: 4.53 MIL/uL (ref 3.87–5.11)
RDW: 12.7 % (ref 11.5–15.5)
WBC: 12.8 10*3/uL — ABNORMAL HIGH (ref 4.0–10.5)

## 2013-09-01 LAB — LIPASE, BLOOD: LIPASE: 22 U/L (ref 11–59)

## 2013-09-01 LAB — PREGNANCY, URINE: Preg Test, Ur: NEGATIVE

## 2013-09-01 MED ORDER — PANTOPRAZOLE SODIUM 40 MG PO TBEC
40.0000 mg | DELAYED_RELEASE_TABLET | Freq: Every day | ORAL | Status: DC
  Administered 2013-09-01: 40 mg via ORAL

## 2013-09-01 MED ORDER — SODIUM CHLORIDE 0.9 % IV BOLUS (SEPSIS)
1000.0000 mL | Freq: Once | INTRAVENOUS | Status: AC
Start: 2013-09-01 — End: 2013-09-01
  Administered 2013-09-01: 1000 mL via INTRAVENOUS

## 2013-09-01 MED ORDER — MORPHINE SULFATE 2 MG/ML IJ SOLN: 2.0000 mg | Freq: Once | INTRAMUSCULAR | Status: DC

## 2013-09-01 MED ORDER — GI COCKTAIL ~~LOC~~
30.0000 mL | Freq: Once | ORAL | Status: AC
  Administered 2013-09-01: 30 mL via ORAL
  Filled 2013-09-01: qty 30

## 2013-09-01 MED ORDER — PANTOPRAZOLE SODIUM 40 MG PO TBEC
DELAYED_RELEASE_TABLET | ORAL | Status: AC
  Administered 2013-09-01: 40 mg via ORAL
  Filled 2013-09-01: qty 1

## 2013-09-01 MED ORDER — IOHEXOL 300 MG/ML  SOLN
50.0000 mL | Freq: Once | INTRAMUSCULAR | Status: AC | PRN
  Administered 2013-09-01: 50 mL via ORAL

## 2013-09-01 MED ORDER — IOHEXOL 300 MG/ML  SOLN
100.0000 mL | Freq: Once | INTRAMUSCULAR | Status: AC | PRN
  Administered 2013-09-01: 100 mL via INTRAVENOUS

## 2013-09-01 MED ORDER — LANSOPRAZOLE 30 MG PO CPDR: 30.0000 mg | DELAYED_RELEASE_CAPSULE | Freq: Every day | ORAL | Status: DC

## 2013-09-01 NOTE — ED Notes (Signed)
MD at bedside. 

## 2013-09-01 NOTE — ED Notes (Signed)
Pt declines pain meds at this time because she has no transport home.

## 2013-09-01 NOTE — ED Provider Notes (Signed)
CSN: [id number]     Arrival date & time 09/01/13  0304 History   None    Chief Complaint  Patient presents with  . Abdominal Pain   (Consider location/radiation/quality/duration/timing/severity/associated sxs/prior Treatment) HPI Patient presents with 18 hours of abdominal pain that was gradual onset in the central abdomen which is now radiating to the right lower quadrant. She's had no nausea vomiting or diarrhea. She's had no fever or chills. Patient does admit to anorexia. States she did not eat yesterday because she was not hungry. Denies any vaginal bleeding or discharge. She has a small amount of pain with urination but denies frequency or urgency. Patient denies any abdominal surgeries. She states she's never had pain like this. She's been taking aspirin at home for the pain History reviewed. No pertinent past medical history. History reviewed. No pertinent past surgical history. History reviewed. No pertinent family history. History  Substance Use Topics  . Smoking status: Current Every Day Smoker  . Smokeless tobacco: Not on file  . Alcohol Use: No   OB History   Grav Para Term Preterm Abortions TAB SAB Ect Mult Living                 Review of Systems  Constitutional: Positive for appetite change. Negative for fever and chills.  Respiratory: Negative for shortness of breath.   Cardiovascular: Negative for chest pain.  Gastrointestinal: Positive for abdominal pain. Negative for nausea, vomiting and diarrhea.  Genitourinary: Positive for dysuria. Negative for urgency, frequency, hematuria, flank pain, vaginal bleeding, vaginal discharge and pelvic pain.  Musculoskeletal: Negative for back pain, neck pain and neck stiffness.  Skin: Negative for rash and wound.  Neurological: Negative for dizziness, weakness, light-headedness, numbness and headaches.  All other systems reviewed and are negative.    Allergies  Review of patient's allergies indicates no known  allergies.  Home Medications   Current Outpatient Rx  Name  Route  Sig  Dispense  Refill  . acetaminophen (TYLENOL) 500 MG tablet   Oral   Take 500-1,000 mg by mouth daily as needed (headache pain). For pain         . meclizine (ANTIVERT) 25 MG tablet   Oral   Take 1 tablet (25 mg total) by mouth 3 (three) times daily as needed for dizziness.   15 tablet   0   . ondansetron (ZOFRAN) 4 MG tablet   Oral   Take 1 tablet (4 mg total) by mouth every 8 (eight) hours as needed for nausea.   6 tablet   0    BP 126/76  Pulse 102  Temp(Src) 98 F (36.7 C) (Oral)  Resp 18  Ht 5\' 4"  (1.626 m)  Wt 168 lb 5 oz (76.346 kg)  BMI 28.88 kg/m2  SpO2 100%  LMP 08/20/2013 Physical Exam  Nursing note and vitals reviewed. Constitutional: She is oriented to person, place, and time. She appears well-developed and well-nourished. No distress.  HENT:  Head: Normocephalic and atraumatic.  Mouth/Throat: Oropharynx is clear and moist.  Eyes: EOM are normal. Pupils are equal, round, and reactive to light.  Neck: Normal range of motion. Neck supple.  Cardiovascular: Normal rate and regular rhythm.   Pulmonary/Chest: Effort normal and breath sounds normal. No respiratory distress. She has no wheezes. She has no rales. She exhibits no tenderness.  Abdominal: Soft. Bowel sounds are normal. She exhibits no distension and no mass. There is tenderness (tenderness to palpation in the right lower quadrant without rebound or guarding.  patient has very mild epigastric and right upper quadrant pain). There is no rebound and no guarding.  Musculoskeletal: Normal range of motion. She exhibits no edema and no tenderness.  No CVA tenderness bilaterally.  Neurological: She is alert and oriented to person, place, and time.  Skin: Skin is warm and dry. No rash noted. No erythema.  Psychiatric: She has a normal mood and affect. Her behavior is normal.    ED Course  Procedures (including critical care time) Labs  Review Labs Reviewed  PREGNANCY, URINE  URINALYSIS, ROUTINE W REFLEX MICROSCOPIC  CBC WITH DIFFERENTIAL  COMPREHENSIVE METABOLIC PANEL  LIPASE, BLOOD   Imaging Review No results found.  EKG Interpretation   None       MDM  Patient with a right lower quadrant pain. Her history is consistent with possibly early appendicitis. Will obtain CT of the abdomen/pelvis. CT scan is without acute findings. The patient has a mild elevation in white blood cells which has been seen in the previous lab work. She has no other abnormalities. She is resting comfortably and states that her pain is improved with no pain medication at this point. Reexamination of her abdomen reveals a soft abdomen. She has mild epigastric and right-sided umbilical area pain. Given the history of her aspirin use, this may be gastritis/duodenitis. Will start on PPI and have her followup with gastroenterologist if her symptoms persist. I have advised her to get against using aspirin or other NSAIDs. Patient has been instructed to return immediately for any worsening pain, persistent vomiting, dark stools, fever or for any concerns.  Loren Raceravid Oluchi Pucci, MD 09/01/13 [phone]

## 2013-09-01 NOTE — ED Notes (Signed)
C/o abd pain x 2 days, denies any n/v/d. Pt denies any fever or urinary sxs.

## 2013-09-01 NOTE — Discharge Instructions (Signed)
Take medication as prescribed. You may also take over-the-counter Mylanta or Maalox for your pain. Avoid taking aspirin, ibuprofen and other NSAIDs. Avoid spicy or acidic foods. Followup with gastroenterologist if your symptoms persist. If your symptoms worsen( including increased pain, vomiting, fever) return immediately to the emergency department.Gastritis, Adult Gastritis is soreness and swelling (inflammation) of the lining of the stomach. Gastritis can develop as a sudden onset (acute) or long-term (chronic) condition. If gastritis is not treated, it can lead to stomach bleeding and ulcers. CAUSES  Gastritis occurs when the stomach lining is weak or damaged. Digestive juices from the stomach then inflame the weakened stomach lining. The stomach lining may be weak or damaged due to viral or bacterial infections. One common bacterial infection is the Helicobacter pylori infection. Gastritis can also result from excessive alcohol consumption, taking certain medicines, or having too much acid in the stomach.  SYMPTOMS  In some cases, there are no symptoms. When symptoms are present, they may include: Pain or a burning sensation in the upper abdomen. Nausea. Vomiting. An uncomfortable feeling of fullness after eating. DIAGNOSIS  Your caregiver may suspect you have gastritis based on your symptoms and a physical exam. To determine the cause of your gastritis, your caregiver may perform the following: Blood or stool tests to check for the H pylori bacterium. Gastroscopy. A thin, flexible tube (endoscope) is passed down the esophagus and into the stomach. The endoscope has a light and camera on the end. Your caregiver uses the endoscope to view the inside of the stomach. Taking a tissue sample (biopsy) from the stomach to examine under a microscope. TREATMENT  Depending on the cause of your gastritis, medicines may be prescribed. If you have a bacterial infection, such as an H pylori infection,  antibiotics may be given. If your gastritis is caused by too much acid in the stomach, H2 blockers or antacids may be given. Your caregiver may recommend that you stop taking aspirin, ibuprofen, or other nonsteroidal anti-inflammatory drugs (NSAIDs). HOME CARE INSTRUCTIONS Only take over-the-counter or prescription medicines as directed by your caregiver. If you were given antibiotic medicines, take them as directed. Finish them even if you start to feel better. Drink enough fluids to keep your urine clear or pale yellow. Avoid foods and drinks that make your symptoms worse, such as: Caffeine or alcoholic drinks. Chocolate. Peppermint or mint flavorings. Garlic and onions. Spicy foods. Citrus fruits, such as oranges, lemons, or limes. Tomato-based foods such as sauce, chili, salsa, and pizza. Fried and fatty foods. Eat small, frequent meals instead of large meals. SEEK IMMEDIATE MEDICAL CARE IF:  You have black or dark red stools. You vomit blood or material that looks like coffee grounds. You are unable to keep fluids down. Your abdominal pain gets worse. You have a fever. You do not feel better after 1 week. You have any other questions or concerns. MAKE SURE YOU: Understand these instructions. Will watch your condition. Will get help right away if you are not doing well or get worse. Document Released: 07/07/2001 Document Revised: 01/12/2012 Document Reviewed: 08/26/2011 [facility] Patient Information 2014 [place], [place].   Abdominal Pain, Women Abdominal (stomach, pelvic, or belly) pain can be caused by many things. It is important to tell your doctor:  The location of the pain.  Does it come and go or is it present all the time?  Are there things that start the pain (eating certain foods, exercise)?  Are there other symptoms associated with the pain (fever, nausea,  vomiting, diarrhea)? All of this is helpful to know when trying to find the cause of the pain. CAUSES    Stomach: virus or bacteria infection, or ulcer.  Intestine: appendicitis (inflamed appendix), regional ileitis (Crohn's disease), ulcerative colitis (inflamed colon), irritable bowel syndrome, diverticulitis (inflamed diverticulum of the colon), or cancer of the stomach or intestine.  Gallbladder disease or stones in the gallbladder.  Kidney disease, kidney stones, or infection.  Pancreas infection or cancer.  Fibromyalgia (pain disorder).  Diseases of the female organs:  Uterus: fibroid (non-cancerous) tumors or infection.  Fallopian tubes: infection or tubal pregnancy.  Ovary: cysts or tumors.  Pelvic adhesions (scar tissue).  Endometriosis (uterus lining tissue growing in the pelvis and on the pelvic organs).  Pelvic congestion syndrome (female organs filling up with blood just before the menstrual period).  Pain with the menstrual period.  Pain with ovulation (producing an egg).  Pain with an IUD (intrauterine device, birth control) in the uterus.  Cancer of the female organs.  Functional pain (pain not caused by a disease, may improve without treatment).  Psychological pain.  Depression. DIAGNOSIS  Your doctor will decide the seriousness of your pain by doing an examination.  Blood tests.  X-rays.  Ultrasound.  CT scan (computed tomography, special type of X-ray).  MRI (magnetic resonance imaging).  Cultures, for infection.  Barium enema (dye inserted in the large intestine, to better view it with X-rays).  Colonoscopy (looking in intestine with a lighted tube).  Laparoscopy (minor surgery, looking in abdomen with a lighted tube).  Major abdominal exploratory surgery (looking in abdomen with a large incision). TREATMENT  The treatment will depend on the cause of the pain.   Many cases can be observed and treated at home.  Over-the-counter medicines recommended by your caregiver.  Prescription medicine.  Antibiotics, for  infection.  Birth control pills, for painful periods or for ovulation pain.  Hormone treatment, for endometriosis.  Nerve blocking injections.  Physical therapy.  Antidepressants.  Counseling with a psychologist or psychiatrist.  Minor or major surgery. HOME CARE INSTRUCTIONS   Do not take laxatives, unless directed by your caregiver.  Take over-the-counter pain medicine only if ordered by your caregiver. Do not take aspirin because it can cause an upset stomach or bleeding.  Try a clear liquid diet (broth or water) as ordered by your caregiver. Slowly move to a bland diet, as tolerated, if the pain is related to the stomach or intestine.  Have a thermometer and take your temperature several times a day, and record it.  Bed rest and sleep, if it helps the pain.  Avoid sexual intercourse, if it causes pain.  Avoid stressful situations.  Keep your follow-up appointments and tests, as your caregiver orders.  If the pain does not go away with medicine or surgery, you may try:  Acupuncture.  Relaxation exercises (yoga, meditation).  Group therapy.  Counseling. SEEK MEDICAL CARE IF:   You notice certain foods cause stomach pain.  Your home care treatment is not helping your pain.  You need stronger pain medicine.  You want your IUD removed.  You feel faint or lightheaded.  You develop nausea and vomiting.  You develop a rash.  You are having side effects or an allergy to your medicine. SEEK IMMEDIATE MEDICAL CARE IF:   Your pain does not go away or gets worse.  You have a fever.  Your pain is felt only in portions of the abdomen. The right side could possibly be appendicitis.  The left lower portion of the abdomen could be colitis or diverticulitis.  You are passing blood in your stools (bright red or black tarry stools, with or without vomiting).  You have blood in your urine.  You develop chills, with or without a fever.  You pass out. MAKE SURE  YOU:   Understand these instructions.  Will watch your condition.  Will get help right away if you are not doing well or get worse. Document Released: 05/10/2007 Document Revised: 10/05/2011 Document Reviewed: 05/30/2009 [facility] Patient Information 2014 [place], [place].

## 2013-09-01 NOTE — ED Notes (Signed)
Patient transported to CT 

## 2013-12-25 ENCOUNTER — Emergency Department (HOSPITAL_BASED_OUTPATIENT_CLINIC_OR_DEPARTMENT_OTHER)
Admission: EM | Admit: 2013-12-25 | Discharge: 2013-12-25 | Disposition: A | Discharge status: Home / Self Care | Payer: Self-pay | Attending: Emergency Medicine | Admitting: Emergency Medicine

## 2013-12-25 ENCOUNTER — Encounter (HOSPITAL_BASED_OUTPATIENT_CLINIC_OR_DEPARTMENT_OTHER): Payer: Self-pay | Admitting: Emergency Medicine

## 2013-12-25 ENCOUNTER — Emergency Department (HOSPITAL_BASED_OUTPATIENT_CLINIC_OR_DEPARTMENT_OTHER): Payer: Self-pay

## 2013-12-25 DIAGNOSIS — O99330 Smoking (tobacco) complicating pregnancy, unspecified trimester: Secondary | ICD-10-CM | POA: Insufficient documentation

## 2013-12-25 DIAGNOSIS — A499 Bacterial infection, unspecified: Secondary | ICD-10-CM | POA: Insufficient documentation

## 2013-12-25 DIAGNOSIS — B9689 Other specified bacterial agents as the cause of diseases classified elsewhere: Secondary | ICD-10-CM | POA: Insufficient documentation

## 2013-12-25 DIAGNOSIS — O200 Threatened abortion: Secondary | ICD-10-CM | POA: Insufficient documentation

## 2013-12-25 DIAGNOSIS — Z79899 Other long term (current) drug therapy: Secondary | ICD-10-CM | POA: Insufficient documentation

## 2013-12-25 DIAGNOSIS — N760 Acute vaginitis: Secondary | ICD-10-CM | POA: Insufficient documentation

## 2013-12-25 DIAGNOSIS — O98819 Other maternal infectious and parasitic diseases complicating pregnancy, unspecified trimester: Secondary | ICD-10-CM | POA: Insufficient documentation

## 2013-12-25 DIAGNOSIS — O2390 Unspecified genitourinary tract infection in pregnancy, unspecified trimester: Secondary | ICD-10-CM | POA: Insufficient documentation

## 2013-12-25 DIAGNOSIS — O4690 Antepartum hemorrhage, unspecified, unspecified trimester: Secondary | ICD-10-CM

## 2013-12-25 LAB — URINALYSIS, ROUTINE W REFLEX MICROSCOPIC
Bilirubin Urine: NEGATIVE
GLUCOSE, UA: NEGATIVE mg/dL
Ketones, ur: NEGATIVE mg/dL
Leukocytes, UA: NEGATIVE
NITRITE: NEGATIVE
PH: 6.5 (ref 5.0–8.0)
Protein, ur: NEGATIVE mg/dL
Specific Gravity, Urine: 1.007 (ref 1.005–1.030)
Urobilinogen, UA: 0.2 mg/dL (ref 0.0–1.0)

## 2013-12-25 LAB — CBC WITH DIFFERENTIAL/PLATELET
BASOS ABS: 0 10*3/uL (ref 0.0–0.1)
Basophils Relative: 0 % (ref 0–1)
Eosinophils Absolute: 0.2 10*3/uL (ref 0.0–0.7)
Eosinophils Relative: 2 % (ref 0–5)
HEMATOCRIT: 37.3 % (ref 36.0–46.0)
HEMOGLOBIN: 13.1 g/dL (ref 12.0–15.0)
LYMPHS ABS: 3.6 10*3/uL (ref 0.7–4.0)
Lymphocytes Relative: 30 % (ref 12–46)
MCH: 32 pg (ref 26.0–34.0)
MCHC: 35.1 g/dL (ref 30.0–36.0)
MCV: 91.2 fL (ref 78.0–100.0)
MONO ABS: 0.8 10*3/uL (ref 0.1–1.0)
Monocytes Relative: 6 % (ref 3–12)
NEUTROS ABS: 7.4 10*3/uL (ref 1.7–7.7)
Neutrophils Relative %: 62 % (ref 43–77)
Platelets: 276 10*3/uL (ref 150–400)
RBC: 4.09 MIL/uL (ref 3.87–5.11)
RDW: 12.3 % (ref 11.5–15.5)
WBC: 12 10*3/uL — AB (ref 4.0–10.5)

## 2013-12-25 LAB — PREGNANCY, URINE: PREG TEST UR: POSITIVE — AB

## 2013-12-25 LAB — BASIC METABOLIC PANEL
BUN: 7 mg/dL (ref 6–23)
CALCIUM: 9.7 mg/dL (ref 8.4–10.5)
CHLORIDE: 101 meq/L (ref 96–112)
CO2: 20 meq/L (ref 19–32)
Creatinine, Ser: 0.4 mg/dL — ABNORMAL LOW (ref 0.50–1.10)
GFR calc Af Amer: >90 mL/min (ref >90)
GFR calc non Af Amer: >90 mL/min (ref >90)
GLUCOSE: 107 mg/dL — AB (ref 70–99)
Potassium: 3.5 mEq/L — ABNORMAL LOW (ref 3.7–5.3)
Sodium: 139 mEq/L (ref 137–147)

## 2013-12-25 LAB — GC/CHLAMYDIA PROBE AMP
CT Probe RNA: NEGATIVE
GC Probe RNA: NEGATIVE

## 2013-12-25 LAB — WET PREP, GENITAL
Trich, Wet Prep: NONE SEEN
YEAST WET PREP: NONE SEEN

## 2013-12-25 LAB — ABO/RH: ABO/RH(D): O POS

## 2013-12-25 LAB — URINE MICROSCOPIC-ADD ON

## 2013-12-25 LAB — HCG, QUANTITATIVE, PREGNANCY: HCG, BETA CHAIN, QUANT, S: 638 m[IU]/mL — AB (ref <5)

## 2013-12-25 MED ORDER — METRONIDAZOLE 500 MG PO TABS: 500.0000 mg | ORAL_TABLET | Freq: Two times a day (BID) | ORAL | Status: DC

## 2013-12-25 NOTE — Discharge Instructions (Signed)
Threatened Miscarriage   A threatened miscarriage is a pregnancy that may end. It may be marked by bleeding during the first 20 weeks of pregnancy. Often, the pregnancy can continue without any more problems. You may be asked to stop:  · Having sex (intercourse).  · Having orgasms.  · Using tampons.  · Exercising.  · Doing heavy physical activity and work.  HOME CARE   · Your doctor may tell you to take bed rest and to stop activities and work.  · Write down the number of pads you use each day. Write down how often you change pads. Write down how soaked they are.  · Follow your doctor's advice for follow-up visits and tests.  · If your blood type is Rh-negative and the father's blood is Rh-positive (or is not known), you may get a shot to protect the baby.  · If you have a miscarriage, save all the tissue you pass in a container. Take the container to your doctor.  GET HELP RIGHT AWAY IF:   · You have bad cramps or pain in your belly (abdomen), lower belly, or back.  · You have a fever or chills.  · Your bleeding gets worse or you pass large clots of blood or tissue. Save this tissue to show your doctor.  · You feel lightheaded, weak, dizzy, or pass out (faint).  · You have a gush of fluid from your vagina.  MAKE SURE YOU:   · Understand these instructions.  · Will watch your condition.  · Will get help right away if you are not doing well or get worse.  Document Released: 06/25/2008 Document Revised: 10/05/2011 Document Reviewed: 07/29/2009  [employer]® Patient Information ©2014 [employer].

## 2013-12-25 NOTE — ED Notes (Signed)
Vaginal bleeding that started this am- states she is 2 months pregnant

## 2013-12-25 NOTE — ED Provider Notes (Signed)
CSN: [id number]     Arrival date & time 12/25/13  1041 History   First MD Initiated Contact with Patient 12/25/13 1051     Chief Complaint  Patient presents with  . Vaginal Bleeding     (Consider location/radiation/quality/duration/timing/severity/associated sxs/prior Treatment) HPI Comments: Pt states that she is 2 months pregnant and she started bleeding with wiping this morning. Has not had pain. Hasn't seen a physician with this pregnancy yet. Denies dysuria. 5th pregnancy, has 4 healthy kids and she has not had problems with other pregnancy  The history is provided by the patient. No language interpreter was used.    History reviewed. No pertinent past medical history. History reviewed. No pertinent past surgical history. No family history on file. History  Substance Use Topics  . Smoking status: Current Every Day Smoker -- 0.50 packs/day    Types: Cigarettes  . Smokeless tobacco: Not on file  . Alcohol Use: No   OB History   Grav Para Term Preterm Abortions TAB SAB Ect Mult Living   1              Review of Systems  Constitutional: Negative.   Respiratory: Negative.   Cardiovascular: Negative.       Allergies  Review of patient's allergies indicates no known allergies.  Home Medications   Prior to Admission medications   Medication Sig Start Date End Date Taking? Authorizing Provider  Prenatal Vit-Fe Fumarate-FA (MULTIVITAMIN-PRENATAL) 27-0.8 MG TABS tablet Take 1 tablet by mouth daily at 12 noon.   Yes Historical Provider, MD  acetaminophen (TYLENOL) 500 MG tablet Take 500-1,000 mg by mouth daily as needed (headache pain). For pain    Historical Provider, MD  lansoprazole (PREVACID) 30 MG capsule Take 1 capsule (30 mg total) by mouth daily at 12 noon. 09/01/13   Loren Racer, MD  meclizine (ANTIVERT) 25 MG tablet Take 1 tablet (25 mg total) by mouth 3 (three) times daily as needed for dizziness. 09/12/12   Laray Anger, DO  ondansetron (ZOFRAN) 4 MG tablet  Take 1 tablet (4 mg total) by mouth every 8 (eight) hours as needed for nausea. 09/12/12   Laray Anger, DO   BP 131/77  Pulse 88  Temp(Src) 100.1 F (37.8 C) (Oral)  Resp 16  Wt 170 lb (77.111 kg)  SpO2 100%  LMP 11/05/2013 Physical Exam  Nursing note and vitals reviewed. Constitutional: She appears well-developed and well-nourished.  Cardiovascular: Normal rate and regular rhythm.   Pulmonary/Chest: Effort normal and breath sounds normal.  Abdominal: Soft. Bowel sounds are normal. There is no tenderness.  Genitourinary:  Small amount of brown discharge in the vaginal vault  Musculoskeletal: Normal range of motion.  Neurological: She is alert.  Skin: Skin is warm and dry.  Psychiatric: She has a normal mood and affect.    ED Course  Procedures (including critical care time) Labs Review Labs Reviewed  WET PREP, GENITAL - Abnormal; Notable for the following:    Clue Cells Wet Prep HPF POC FEW (*)    WBC, Wet Prep HPF POC FEW (*)    All other components within normal limits  URINALYSIS, ROUTINE W REFLEX MICROSCOPIC - Abnormal; Notable for the following:    Hgb urine dipstick LARGE (*)    All other components within normal limits  PREGNANCY, URINE - Abnormal; Notable for the following:    Preg Test, Ur POSITIVE (*)    All other components within normal limits  CBC WITH DIFFERENTIAL - Abnormal; Notable  for the following:    WBC 12.0 (*)    All other components within normal limits  BASIC METABOLIC PANEL - Abnormal; Notable for the following:    Potassium 3.5 (*)    Glucose, Bld 107 (*)    Creatinine, Ser 0.40 (*)    All other components within normal limits  HCG, QUANTITATIVE, PREGNANCY - Abnormal; Notable for the following:    hCG, Beta Chain, Quant, S 638 (*)    All other components within normal limits  URINE MICROSCOPIC-ADD ON - Abnormal; Notable for the following:    Squamous Epithelial / LPF FEW (*)    Bacteria, UA MANY (*)    All other components within  normal limits  GC/CHLAMYDIA PROBE AMP  ABO/RH    Imaging Review [place] Ob Comp Less 14 Wks  12/25/2013   CLINICAL DATA:  Pregnant, vaginal spotting with urination, quantitative beta HCG 638 ; EGA [redacted] weeks 1 day by LMP  EXAM: OBSTETRIC <14 WK US AND TRANSVAGINAL OB US  TECHNIQUE: Both transabdominal and transvaginal ultrasound examinations were performed for complete evaluation of the gestation as well as the maternal uterus, adnexal regions, and pelvic cul-de-sac. Transvaginal technique was performed to assess early pregnancy.  COMPARISON:  CT abdomen and pelvis 09/01/2013  FINDINGS: Intrauterine gestational sac: Visualized, question slightly irregular.  Yolk sac:  Not identified  Embryo:  Present  Cardiac Activity: Not identified  Heart Rate:  N/A bpm  CRL:   5.4  mm   6 w 2 d  Maternal uterus/adnexae:  Questionable small focus of subchorionic hemorrhage.  LEFT ovary normal size and morphology, 2.6 x 2.9 x 2.4 cm.  RIGHT ovary normal size and morphology 3.2 x 1.1 x 2.2 cm.  No adnexal masses or free pelvic fluid.  IMPRESSION: Gestational sac seen within uterus containing a fetal pole but without visualization of cardiac activity or a yolk sac.  EGA [redacted] weeks 2 days by crown-rump length.  Questionable small focus of subchorionic hemorrhage.  May consider followup ultrasound in 14 days to assess viability if clinically indicated.   Electronically Signed   By: Ulyses SouthwardMark  Boles M.D.   On: 12/25/2013 12:16   [place] Ob Transvaginal  12/25/2013   CLINICAL DATA:  Pregnant, vaginal spotting with urination, quantitative beta HCG 638 ; EGA [redacted] weeks 1 day by LMP  EXAM: OBSTETRIC <14 WK US AND TRANSVAGINAL OB US  TECHNIQUE: Both transabdominal and transvaginal ultrasound examinations were performed for complete evaluation of the gestation as well as the maternal uterus, adnexal regions, and pelvic cul-de-sac. Transvaginal technique was performed to assess early pregnancy.  COMPARISON:  CT abdomen and pelvis 09/01/2013  FINDINGS:  Intrauterine gestational sac: Visualized, question slightly irregular.  Yolk sac:  Not identified  Embryo:  Present  Cardiac Activity: Not identified  Heart Rate:  N/A bpm  CRL:   5.4  mm   6 w 2 d  Maternal uterus/adnexae:  Questionable small focus of subchorionic hemorrhage.  LEFT ovary normal size and morphology, 2.6 x 2.9 x 2.4 cm.  RIGHT ovary normal size and morphology 3.2 x 1.1 x 2.2 cm.  No adnexal masses or free pelvic fluid.  IMPRESSION: Gestational sac seen within uterus containing a fetal pole but without visualization of cardiac activity or a yolk sac.  EGA [redacted] weeks 2 days by crown-rump length.  Questionable small focus of subchorionic hemorrhage.  May consider followup ultrasound in 14 days to assess viability if clinically indicated.   Electronically Signed   By: Loraine LericheMark  Tyron Russell M.D.   On: 12/25/2013 12:16     EKG Interpretation None      MDM   Final diagnoses:  Vaginal bleeding in pregnancy  BV (bacterial vaginosis)  Threatened miscarriage    Discussed possibility of miscarriage with pt. Pt verbalized understanding. Discussed need for follow up with pt    Teressa Lower, NP 12/25/13 1617

## 2013-12-25 NOTE — ED Notes (Signed)
NP called lab about RH results , still pending

## 2013-12-26 ENCOUNTER — Inpatient Hospital Stay (HOSPITAL_COMMUNITY)
Admission: AD | Admit: 2013-12-26 | Discharge: 2013-12-26 | Disposition: A | Discharge status: Home / Self Care | Payer: Self-pay | Source: Ambulatory Visit | Attending: Obstetrics & Gynecology | Admitting: Obstetrics & Gynecology

## 2013-12-26 ENCOUNTER — Encounter (HOSPITAL_COMMUNITY): Payer: Self-pay | Admitting: *Deleted

## 2013-12-26 DIAGNOSIS — O200 Threatened abortion: Secondary | ICD-10-CM | POA: Insufficient documentation

## 2013-12-26 DIAGNOSIS — O039 Complete or unspecified spontaneous abortion without complication: Secondary | ICD-10-CM

## 2013-12-26 DIAGNOSIS — O99330 Smoking (tobacco) complicating pregnancy, unspecified trimester: Secondary | ICD-10-CM | POA: Insufficient documentation

## 2013-12-26 LAB — CBC
HCT: 36 % (ref 36.0–46.0)
Hemoglobin: 12.6 g/dL (ref 12.0–15.0)
MCH: 31.8 pg (ref 26.0–34.0)
MCHC: 35 g/dL (ref 30.0–36.0)
MCV: 90.9 fL (ref 78.0–100.0)
PLATELETS: 254 10*3/uL (ref 150–400)
RBC: 3.96 MIL/uL (ref 3.87–5.11)
RDW: 12.7 % (ref 11.5–15.5)
WBC: 11.5 10*3/uL — ABNORMAL HIGH (ref 4.0–10.5)

## 2013-12-26 MED ORDER — IBUPROFEN 600 MG PO TABS: 600.0000 mg | ORAL_TABLET | Freq: Four times a day (QID) | ORAL | Status: DC | PRN

## 2013-12-26 MED ORDER — HYDROCODONE-ACETAMINOPHEN 5-325 MG PO TABS
1.0000 | ORAL_TABLET | Freq: Four times a day (QID) | ORAL | Status: DC | PRN
Start: 2013-12-26 — End: 2017-06-22

## 2013-12-26 MED ORDER — KETOROLAC TROMETHAMINE 60 MG/2ML IM SOLN
60.0000 mg | Freq: Once | INTRAMUSCULAR | Status: AC
  Administered 2013-12-26: 60 mg via INTRAMUSCULAR
  Filled 2013-12-26: qty 2

## 2013-12-26 NOTE — Progress Notes (Signed)
12/26/13 1200  Clinical Encounter Type  Visited With Patient and family together Wendy Vincent)  Visit Type (miscarriage support)  Referral From Nurse  Spiritual Encounters  Spiritual Needs Grief support;Emotional;Brochure (Comfort Packet and handmade hearts)  Stress Factors  Patient Stress Factors Loss  Family Stress Factors Loss   Provided bereavement support to [place] and [place] Hua, encouraging self-care, offering affirmation of their care for each other, providing each a choice of handmade Comfort heart, and reviewing Comfort Packet and ongoing chaplain availability for further support.  Family appreciative.    [address] [place], [place] [phone]

## 2013-12-26 NOTE — MAU Note (Signed)
From desk to rm, active bleeding, running down her leg.  Pt tearful

## 2013-12-26 NOTE — MAU Provider Note (Signed)
History     CSN: [id number]  Arrival date and time: 12/26/13 1013   First Provider Initiated Contact with Patient 12/26/13 1031      Chief Complaint  Patient presents with  . Vaginal Bleeding   HPI  38 yo G1P0 @ 4242w2d who presents for heavy vaginal bleeding.   States [facility] some bleeding when she wiped. US at that time showed irregular gestational sac and fetal pole without yolk sac or cardiac activity. Was discharged home with recommendations to f/u for US in 2 weeks for viability. This am started bleeding very heavily. Was at work going to the bathroom and all of a sudden started bleeding.  Passed something that was big and grey/clotted and then continued to bleed so came straight here. No abdominal pain until getting here and now slight cramping.   No fevers, chills, nausea, vomiting, lightheadedness, sob, tachycardia.    OB History   Grav Para Term Preterm Abortions TAB SAB Ect Mult Living   1               No past medical history on file.  No past surgical history on file.  No family history on file.  History  Substance Use Topics  . Smoking status: Current Every Day Smoker -- 0.50 packs/day    Types: Cigarettes  . Smokeless tobacco: Not on file  . Alcohol Use: No    Allergies: No Known Allergies  Prescriptions prior to admission  Medication Sig Dispense Refill  . acetaminophen (TYLENOL) 500 MG tablet Take 500-1,000 mg by mouth daily as needed (headache pain). For pain      . lansoprazole (PREVACID) 30 MG capsule Take 1 capsule (30 mg total) by mouth daily at 12 noon.  30 capsule  0  . meclizine (ANTIVERT) 25 MG tablet Take 1 tablet (25 mg total) by mouth 3 (three) times daily as needed for dizziness.  15 tablet  0  . metroNIDAZOLE (FLAGYL) 500 MG tablet Take 1 tablet (500 mg total) by mouth 2 (two) times daily.  14 tablet  0  . ondansetron (ZOFRAN) 4 MG tablet Take 1 tablet (4 mg total) by mouth every 8 (eight) hours as needed for nausea.   6 tablet  0  . Prenatal Vit-Fe Fumarate-FA (MULTIVITAMIN-PRENATAL) 27-0.8 MG TABS tablet Take 1 tablet by mouth daily at 12 noon.        ROS- see above. All other systems reviewed and negative.  Physical Exam   Blood pressure 142/72, pulse 100, temperature 98.3 F (36.8 C), temperature source Oral, resp. rate 18, last menstrual period 11/05/2013, SpO2 98.00%.  Physical Exam  Constitutional: She appears well-developed and well-nourished.  tearful  HENT:  Head: Normocephalic and atraumatic.  Neck: Neck supple.  Cardiovascular: Normal rate, regular rhythm and normal heart sounds.   Respiratory: Effort normal and breath sounds normal.  GI: Soft. Bowel sounds are normal. She exhibits no distension. There is no tenderness.  Genitourinary:  NEFG. Large amount of blood and clot in the vault and pouring out onto a blue chuck pad. Small amount of tissue noted at the cervical os. Removed with ring forcep.  Cervix visually open.       MAU Course  Procedures  MDM cbc  Assessment and Plan   38 yo G1 @ 5942w2d who presents for bleeding and found to be in the process of having an inevitable miscarriage.  Suspect majority of tissue already passed while she was still at work but [employer]  amount of tissue removed here in MAU today.   - hgb stable but not at nadir at 12.6 - O pos bloodtype - toradol IM given for pain - discussed natural progression of miscarriage and bleeding and pain - f/u in 2 weeks in clinic for repeat HCG (note sent to clinic) - reasons to come in sooner discussed including bleeding that is persistently heavy for greater than 48 hours and severe pain not responding to medications - ibuprofen 800mg  TID and norco prn.    Wendy Vincent L Wendy Vincent 12/26/2013, 10:31 AM

## 2013-12-26 NOTE — Discharge Instructions (Signed)
Aborto espontáneo  °(Miscarriage) °El aborto espontáneo es la pérdida de un bebé que no ha nacido (feto) antes de la semana 20 del embarazo. La mayor parte de estos abortos ocurre en los primeros 3 meses. En algunos casos ocurre antes de que la mujer sepa que está embarazada. También se denomina "aborto espontáneo" o "pérdida prematura del embarazo". El aborto espontáneo puede ser una experiencia que afecte emocionalmente a la persona. Converse con su médico si tiene dudas, cómo es el proceso de duelo, y sobre planes futuros de embarazo.  °CAUSAS  °· Algunos problemas cromosómicos pueden hacer imposible que el bebé se desarrolle normalmente. Los problemas con los genes o cromosomas del bebé son generalmente el resultado de errores que se producen, por casualidad, cuando el embrión se divide y crece. Estos problemas no se heredan de los padres. °· Infección en el cuello del útero.   °· Problemas hormonales.   °· Problemas en el cuello del útero, como tener un útero incompetente. Esto ocurre cuando los tejidos no son lo suficientemente fuertes como para contener el embarazo.   °· Problemas del útero, como un útero con forma anormal, los fibromas o anormalidades congénitas.   °· Ciertas enfermedades crónicas.   °· No fume, no beba alcohol, ni consuma drogas.   °· Traumatismos   °A veces, la causa es desconocida.  °SÍNTOMAS  °· Sangrado o manchado vaginal, con o sin cólicos o dolor. °· Dolor o cólicos en el abdomen o en la cintura. °· Eliminación de líquido, tejidos o coágulos grandes por la vagina. °DIAGNÓSTICO  °El médico le hará un examen físico. También le indicará una ecografía para confirmar el aborto. Es posible que se realicen análisis de sangre.  °TRATAMIENTO  °· En algunos casos el tratamiento no es necesario, si se eliminan naturalmente todos los tejidos embrionarios que se encontraban en el útero. Si el feto o la placenta quedan dentro del útero (aborto incompleto), pueden infectarse, los tejidos que quedan  pueden infectarse y deben retirarse. Generalmente se realiza un procedimiento de dilatación y curetaje (D y C). Durante el procedimiento de dilatación y curetaje, el cuello del útero se abre (dilata) y se retira cualquier resto de tejido fetal o placentario del útero. °· Si hay una infección, le recetarán antibióticos. Podrán recetarle otros medicamentos para reducir el tamaño del útero (contraerlo) si hay una mucho sangrado. °· Si su sangre es Rh negativa y su bebé es Rh positivo, usted necesitará la inyección de inmunoglobulina Rh. Esta inyección protegerá a los futuros bebés de tener problemas de compatibilidad Rh en futuros embarazos. °INSTRUCCIONES PARA EL CUIDADO EN EL HOGAR  °· El médico le indicará reposo en cama o le permitirá realizar actividades livianas. Vuelva a la actividad lentamente o según las indicaciones de su médico. °· Pídale a alguien que la ayude con las responsabilidades familiares y del hogar durante este tiempo.   °· Lleve un registro de la cantidad y la saturación de las toallas higiénicas que utiliza cada día. Anote esta información   °· No use tampones. No No se haga duchas vaginales ni tenga relaciones sexuales hasta que el médico la autorice.   °· Sólo tome medicamentos de venta libre o recetados para calmar el dolor o el malestar, según las indicaciones de su médico.   °· No tome aspirina. La aspirina puede ocasionar hemorragias.   °· Concurra puntualmente a las citas de control con el médico.   °· Si usted o su pareja tienen dificultades con el duelo, hable con su médico para buscar la ayuda psicológica que los ayude a enfrentar la pérdida   del embarazo. Permítase el tiempo suficiente de duelo antes de quedar embarazada nuevamente.   °SOLICITE ATENCIÓN MÉDICA DE INMEDIATO SI:  °· Siente calambres intensos o dolor en la espalda o en el abdomen. °· Tiene fiebre. °· Elimina grandes coágulos de sangre (del tamaño de una nuez o más) o tejidos por la vagina. Guarde lo que ha eliminado para  que su médico lo examine.   °· La hemorragia aumenta.   °· Observa una secreción vaginal espesa y con mal olor. °· Se siente mareada, débil, o se desmaya.   °· Siente escalofríos.   °ASEGÚRESE DE QUE:  °· Comprende estas instrucciones. °· Controlará su enfermedad. °· Solicitará ayuda de inmediato si no mejora o si empeora. °Document Released: 04/22/2005 Document Revised: 11/07/2012 °[employer]® Patient Information ©2014 [employer]. ° °

## 2013-12-28 NOTE — ED Provider Notes (Signed)
Medical screening examination/treatment/procedure(s) were performed by non-physician practitioner and as supervising physician I was immediately available for consultation/collaboration.   EKG Interpretation None        Angy Swearengin David Erhard Senske III, MD 12/28/13 1113 

## 2014-01-09 ENCOUNTER — Other Ambulatory Visit: Payer: Self-pay

## 2014-01-09 DIAGNOSIS — O200 Threatened abortion: Secondary | ICD-10-CM

## 2014-01-09 DIAGNOSIS — O039 Complete or unspecified spontaneous abortion without complication: Secondary | ICD-10-CM

## 2014-01-10 LAB — HCG, QUANTITATIVE, PREGNANCY: hCG, Beta Chain, Quant, S: 3.4 m[IU]/mL

## 2014-01-11 ENCOUNTER — Telehealth: Payer: Self-pay | Admitting: *Deleted

## 2014-01-11 NOTE — Telephone Encounter (Signed)
Message copied by Mannie StabileASH, AMANDA A on Thu Jan 11, 2014  9:01 AM ------      Message from: CONSTANT, PEGGY      Created: Thu Jan 11, 2014  8:59 AM       Please inform patient of complete miscarriage ------

## 2014-01-11 NOTE — Telephone Encounter (Signed)
Called patient and heard message that her voicemail is not set up.

## 2014-01-12 NOTE — Telephone Encounter (Signed)
Called patient and informed of results, pt verbalizes understanding, no further questions.

## 2014-05-28 ENCOUNTER — Encounter (HOSPITAL_COMMUNITY): Payer: Self-pay | Admitting: *Deleted

## 2014-10-31 ENCOUNTER — Encounter (HOSPITAL_COMMUNITY): Payer: Self-pay | Admitting: *Deleted

## 2014-11-17 DIAGNOSIS — O24419 Gestational diabetes mellitus in pregnancy, unspecified control: Secondary | ICD-10-CM

## 2017-06-22 ENCOUNTER — Emergency Department (HOSPITAL_BASED_OUTPATIENT_CLINIC_OR_DEPARTMENT_OTHER)
Admission: EM | Admit: 2017-06-22 | Discharge: 2017-06-22 | Disposition: A | Discharge status: Home / Self Care | Payer: Self-pay | Attending: Physician Assistant | Admitting: Physician Assistant

## 2017-06-22 ENCOUNTER — Other Ambulatory Visit: Payer: Self-pay

## 2017-06-22 ENCOUNTER — Encounter (HOSPITAL_BASED_OUTPATIENT_CLINIC_OR_DEPARTMENT_OTHER): Payer: Self-pay | Admitting: *Deleted

## 2017-06-22 DIAGNOSIS — F17210 Nicotine dependence, cigarettes, uncomplicated: Secondary | ICD-10-CM | POA: Insufficient documentation

## 2017-06-22 DIAGNOSIS — M791 Myalgia, unspecified site: Secondary | ICD-10-CM | POA: Insufficient documentation

## 2017-06-22 DIAGNOSIS — R112 Nausea with vomiting, unspecified: Secondary | ICD-10-CM | POA: Insufficient documentation

## 2017-06-22 DIAGNOSIS — R197 Diarrhea, unspecified: Secondary | ICD-10-CM | POA: Insufficient documentation

## 2017-06-22 LAB — URINALYSIS, ROUTINE W REFLEX MICROSCOPIC
Bilirubin Urine: NEGATIVE
Glucose, UA: NEGATIVE mg/dL
Ketones, ur: NEGATIVE mg/dL
LEUKOCYTES UA: NEGATIVE
Nitrite: NEGATIVE
PROTEIN: NEGATIVE mg/dL
SPECIFIC GRAVITY, URINE: 1.01 (ref 1.005–1.030)
pH: 6 (ref 5.0–8.0)

## 2017-06-22 LAB — COMPREHENSIVE METABOLIC PANEL
ALBUMIN: 4.3 g/dL (ref 3.5–5.0)
ALT: 20 U/L (ref 14–54)
AST: 18 U/L (ref 15–41)
Alkaline Phosphatase: 56 U/L (ref 38–126)
Anion gap: 6 (ref 5–15)
BUN: 9 mg/dL (ref 6–20)
CHLORIDE: 100 mmol/L — AB (ref 101–111)
CO2: 28 mmol/L (ref 22–32)
CREATININE: 0.53 mg/dL (ref 0.44–1.00)
Calcium: 9.2 mg/dL (ref 8.9–10.3)
GFR calc Af Amer: >60 mL/min (ref >60)
GLUCOSE: 104 mg/dL — AB (ref 65–99)
POTASSIUM: 3.7 mmol/L (ref 3.5–5.1)
SODIUM: 134 mmol/L — AB (ref 135–145)
Total Bilirubin: 0.3 mg/dL (ref 0.3–1.2)
Total Protein: 7.3 g/dL (ref 6.5–8.1)

## 2017-06-22 LAB — CBC
HEMATOCRIT: 40.5 % (ref 36.0–46.0)
Hemoglobin: 13.6 g/dL (ref 12.0–15.0)
MCH: 30.3 pg (ref 26.0–34.0)
MCHC: 33.6 g/dL (ref 30.0–36.0)
MCV: 90.2 fL (ref 78.0–100.0)
PLATELETS: 278 10*3/uL (ref 150–400)
RBC: 4.49 MIL/uL (ref 3.87–5.11)
RDW: 13.1 % (ref 11.5–15.5)
WBC: 12.4 10*3/uL — ABNORMAL HIGH (ref 4.0–10.5)

## 2017-06-22 LAB — URINALYSIS, MICROSCOPIC (REFLEX)

## 2017-06-22 LAB — PREGNANCY, URINE: Preg Test, Ur: NEGATIVE

## 2017-06-22 LAB — LIPASE, BLOOD: LIPASE: 23 U/L (ref 11–51)

## 2017-06-22 MED ORDER — ONDANSETRON HCL 4 MG/2ML IJ SOLN
4.0000 mg | Freq: Once | INTRAMUSCULAR | Status: AC
  Administered 2017-06-22: 4 mg via INTRAVENOUS
  Filled 2017-06-22: qty 2

## 2017-06-22 MED ORDER — SODIUM CHLORIDE 0.9 % IV BOLUS (SEPSIS)
1000.0000 mL | Freq: Once | INTRAVENOUS | Status: AC
  Administered 2017-06-22: 1000 mL via INTRAVENOUS

## 2017-06-22 MED ORDER — ONDANSETRON 4 MG PO TBDP: 4.0000 mg | ORAL_TABLET | Freq: Three times a day (TID) | ORAL | 0 refills | Status: DC | PRN

## 2017-06-22 NOTE — ED Triage Notes (Signed)
Pt c/o n/v/d x 5 days.

## 2017-06-22 NOTE — Discharge Instructions (Signed)
As discussed, make sure you stay well-hydrated and start reintroducing foods slowly beginning with fluids, broths, bland foods.  Takes Zofran as needed for nausea and vomiting. Follow up with a primary care provider.  Return sooner if symptoms worsen or you experience new concerning symptoms in the meantime.

## 2017-06-22 NOTE — ED Notes (Signed)
Pt able to tolerate gingerale. States she is feeling better.

## 2017-06-22 NOTE — ED Notes (Signed)
ED Provider at bedside. 

## 2017-06-22 NOTE — ED Provider Notes (Signed)
[facility] EMERGENCY DEPARTMENT Provider Note   CSN: [id number] Arrival date & time: 06/22/17  1332     History   Chief Complaint Chief Complaint  Patient presents with  . Emesis    HPI Wendy Vincent is a 41 y.o. female no significant past medical history presenting with sudden onset generalized myalgias 4 days ago later experienced diarrhea and vomiting for the last 3 days. Non-bloody watery diarrhea. She denies any fever, chills, dysuria, hematuria, or URI symptoms.she reports that she has been able to drink and eat but every time she eats she experiences watery diarrhea or vomiting over the last 3 days. She hasn't tried anything for her symptoms. She is experiencing mild nausea at this time. Patient has no known ill contacts with similar symptoms and denies eating any questionable foods. She denies any abdominal pain.  HPI  History reviewed. No pertinent past medical history.  There are no active problems to display for this patient.   History reviewed. No pertinent surgical history.  OB History    Gravida Para Term Preterm AB Living   1             SAB TAB Ectopic Multiple Live Births                   Home Medications    Prior to Admission medications   Medication Sig Start Date End Date Taking? Authorizing Provider  ibuprofen (ADVIL,MOTRIN) 600 MG tablet Take 1 tablet (600 mg total) by mouth every 6 (six) hours as needed. 12/26/13   Vale HavenBeck, Keli L, MD  ondansetron (ZOFRAN ODT) 4 MG disintegrating tablet Take 1 tablet (4 mg total) by mouth every 8 (eight) hours as needed for nausea or vomiting. 06/22/17   Georgiana ShoreMitchell, Dallie Patton B, PA-C    Family History No family history on file.  Social History Social History   Tobacco Use  . Smoking status: Current Every Day Smoker    Packs/day: 0.50    Types: Cigarettes  Substance Use Topics  . Alcohol use: No  . Drug use: No     Allergies   Patient has no known allergies.   Review of Systems Review of  Systems  Constitutional: Positive for fatigue. Negative for activity change, appetite change, chills and fever.  HENT: Negative for congestion, ear pain, sinus pressure, sinus pain, sore throat and trouble swallowing.   Respiratory: Negative for cough, choking, chest tightness, shortness of breath, wheezing and stridor.   Cardiovascular: Negative for chest pain and palpitations.  Gastrointestinal: Positive for diarrhea, nausea and vomiting. Negative for abdominal distention, abdominal pain and blood in stool.  Genitourinary: Negative for difficulty urinating, dysuria, flank pain, frequency and hematuria.  Musculoskeletal: Positive for myalgias. Negative for arthralgias, back pain, gait problem, joint swelling, neck pain and neck stiffness.  Skin: Negative for color change, pallor and rash.  Neurological: Negative for dizziness, seizures, syncope, light-headedness and headaches.     Physical Exam Updated Vital Signs BP 135/76 (BP Location: Right Arm)   Pulse 63   Temp 98.2 F (36.8 C)   Resp 16   Ht 5\' 4"  (1.626 m)   Wt 77.1 kg (170 lb)   SpO2 100%   BMI 29.18 kg/m   Physical Exam  Constitutional: She appears well-developed and well-nourished. No distress.  Afebrile, nontoxic-appearing, sitting comfortably in bed in no acute distress.  HENT:  Head: Normocephalic and atraumatic.  Mouth/Throat: Oropharynx is clear and moist. No oropharyngeal exudate.  Eyes: Conjunctivae and EOM are normal.  Right eye exhibits no discharge. Left eye exhibits no discharge. No scleral icterus.  Neck: Normal range of motion. Neck supple.  Cardiovascular: Normal rate, regular rhythm and normal heart sounds.  No murmur heard. Pulmonary/Chest: Effort normal and breath sounds normal. No stridor. No respiratory distress. She has no wheezes. She has no rales.  Abdominal: Soft. Bowel sounds are normal. She exhibits no distension and no mass. There is no tenderness. There is no rebound and no guarding.    Musculoskeletal: Normal range of motion. She exhibits no edema or deformity.  Neurological: She is alert.  Skin: Skin is warm and dry. No rash noted. She is not diaphoretic. No erythema. No pallor.  Psychiatric: She has a normal mood and affect.  Nursing note and vitals reviewed.    ED Treatments / Results  Labs (all labs ordered are listed, but only abnormal results are displayed) Labs Reviewed  URINALYSIS, ROUTINE W REFLEX MICROSCOPIC - Abnormal; Notable for the following components:      Result Value   APPearance HAZY (*)    Hgb urine dipstick SMALL (*)    All other components within normal limits  COMPREHENSIVE METABOLIC PANEL - Abnormal; Notable for the following components:   Sodium 134 (*)    Chloride 100 (*)    Glucose, Bld 104 (*)    All other components within normal limits  CBC - Abnormal; Notable for the following components:   WBC 12.4 (*)    All other components within normal limits  URINALYSIS, MICROSCOPIC (REFLEX) - Abnormal; Notable for the following components:   Bacteria, UA FEW (*)    Squamous Epithelial / LPF 6-30 (*)    All other components within normal limits  PREGNANCY, URINE  LIPASE, BLOOD    EKG  EKG Interpretation None       Radiology No results found.  Procedures Procedures (including critical care time)  Medications Ordered in ED Medications  sodium chloride 0.9 % bolus 1,000 mL (0 mLs Intravenous Stopped 06/22/17 1502)  ondansetron (ZOFRAN) injection 4 mg (4 mg Intravenous Given 06/22/17 1430)     Initial Impression / Assessment and Plan / ED Course  I have reviewed the triage vital signs and the nursing notes.  Pertinent labs & imaging results that were available during my care of the patient were reviewed by me and considered in my medical decision making (see chart for details).    Patient presenting with symptoms consistent with viral gastroenteritis. No abdominal pain. She is well-appearing, nontoxic afebrile, normal  vital signs and stable.  Reassuring exam and workup.  Patient was given anti-emetic and fluids and reported significant improvement. Successful PO challenge.  Discharge home with symptomatic relief and close follow-up with PCP. Advised hydration and slow return to foods.  Discussed strict return precautions and advised to return to the emergency department if experiencing any new or worsening symptoms. Instructions were understood and patient agreed with discharge plan.  Final Clinical Impressions(s) / ED Diagnoses   Final diagnoses:  Nausea vomiting and diarrhea    ED Discharge Orders        Ordered    ondansetron (ZOFRAN ODT) 4 MG disintegrating tablet  Every 8 hours PRN     06/22/17 1625       Georgiana ShoreMitchell, Trendon Zaring B, PA-C 06/22/17 1638    Mackuen, Cindee Saltourteney Lyn, MD 06/26/17 [phone]

## 2018-12-01 ENCOUNTER — Other Ambulatory Visit: Payer: Self-pay

## 2018-12-01 ENCOUNTER — Telehealth: Payer: Self-pay | Admitting: Obstetrics and Gynecology

## 2018-12-01 ENCOUNTER — Emergency Department (HOSPITAL_BASED_OUTPATIENT_CLINIC_OR_DEPARTMENT_OTHER): Payer: Self-pay

## 2018-12-01 ENCOUNTER — Encounter (HOSPITAL_BASED_OUTPATIENT_CLINIC_OR_DEPARTMENT_OTHER): Payer: Self-pay | Admitting: Emergency Medicine

## 2018-12-01 ENCOUNTER — Emergency Department (HOSPITAL_BASED_OUTPATIENT_CLINIC_OR_DEPARTMENT_OTHER)
Admission: EM | Admit: 2018-12-01 | Discharge: 2018-12-01 | Disposition: A | Discharge status: Home / Self Care | Payer: Self-pay | Attending: Emergency Medicine | Admitting: Emergency Medicine

## 2018-12-01 DIAGNOSIS — O209 Hemorrhage in early pregnancy, unspecified: Secondary | ICD-10-CM

## 2018-12-01 DIAGNOSIS — Z3A01 Less than 8 weeks gestation of pregnancy: Secondary | ICD-10-CM | POA: Insufficient documentation

## 2018-12-01 DIAGNOSIS — O99331 Smoking (tobacco) complicating pregnancy, first trimester: Secondary | ICD-10-CM | POA: Insufficient documentation

## 2018-12-01 DIAGNOSIS — O200 Threatened abortion: Secondary | ICD-10-CM | POA: Insufficient documentation

## 2018-12-01 DIAGNOSIS — F17210 Nicotine dependence, cigarettes, uncomplicated: Secondary | ICD-10-CM | POA: Insufficient documentation

## 2018-12-01 LAB — WET PREP, GENITAL
Clue Cells Wet Prep HPF POC: NONE SEEN
Sperm: NONE SEEN
Trich, Wet Prep: NONE SEEN
Yeast Wet Prep HPF POC: NONE SEEN

## 2018-12-01 LAB — BASIC METABOLIC PANEL
Anion gap: 9 (ref 5–15)
BUN: 10 mg/dL (ref 6–20)
CO2: 23 mmol/L (ref 22–32)
Calcium: 8.9 mg/dL (ref 8.9–10.3)
Chloride: 105 mmol/L (ref 98–111)
Creatinine, Ser: 0.52 mg/dL (ref 0.44–1.00)
GFR calc Af Amer: >60 mL/min (ref >60)
GFR calc non Af Amer: >60 mL/min (ref >60)
Glucose, Bld: 124 mg/dL — ABNORMAL HIGH (ref 70–99)
Potassium: 3.5 mmol/L (ref 3.5–5.1)
Sodium: 137 mmol/L (ref 135–145)

## 2018-12-01 LAB — HCG, QUANTITATIVE, PREGNANCY: hCG, Beta Chain, Quant, S: 828 m[IU]/mL — ABNORMAL HIGH (ref <5)

## 2018-12-01 LAB — CBC WITH DIFFERENTIAL/PLATELET
Abs Immature Granulocytes: 0.02 10*3/uL (ref 0.00–0.07)
Basophils Absolute: 0.1 10*3/uL (ref 0.0–0.1)
Basophils Relative: 1 %
Eosinophils Absolute: 0.2 10*3/uL (ref 0.0–0.5)
Eosinophils Relative: 2 %
HCT: 41.4 % (ref 36.0–46.0)
Hemoglobin: 13.1 g/dL (ref 12.0–15.0)
Immature Granulocytes: 0 %
Lymphocytes Relative: 35 %
Lymphs Abs: 3.2 10*3/uL (ref 0.7–4.0)
MCH: 28.9 pg (ref 26.0–34.0)
MCHC: 31.6 g/dL (ref 30.0–36.0)
MCV: 91.4 fL (ref 80.0–100.0)
Monocytes Absolute: 0.6 10*3/uL (ref 0.1–1.0)
Monocytes Relative: 6 %
Neutro Abs: 5.3 10*3/uL (ref 1.7–7.7)
Neutrophils Relative %: 56 %
Platelets: 297 10*3/uL (ref 150–400)
RBC: 4.53 MIL/uL (ref 3.87–5.11)
RDW: 13.9 % (ref 11.5–15.5)
WBC: 9.3 10*3/uL (ref 4.0–10.5)
nRBC: 0 % (ref 0.0–0.2)

## 2018-12-01 LAB — URINALYSIS, ROUTINE W REFLEX MICROSCOPIC
Bilirubin Urine: NEGATIVE
Glucose, UA: NEGATIVE mg/dL
Ketones, ur: NEGATIVE mg/dL
Leukocytes,Ua: NEGATIVE
Nitrite: NEGATIVE
Protein, ur: NEGATIVE mg/dL
Specific Gravity, Urine: <1.005 — ABNORMAL LOW (ref 1.005–1.030)
pH: 6.5 (ref 5.0–8.0)

## 2018-12-01 LAB — URINALYSIS, MICROSCOPIC (REFLEX)

## 2018-12-01 NOTE — ED Notes (Signed)
ED Provider at bedside. 

## 2018-12-01 NOTE — ED Notes (Signed)
Patient transported to Ultrasound 

## 2018-12-01 NOTE — ED Triage Notes (Signed)
Pt states her last normal period was March 19th  Pt states she did a home pregnancy test this weekend and it was positive  Pt states this morning when she went to work she felt like she had to urinate and noticed some light red blood on her panties  Pt denies any abd pain or cramping

## 2018-12-01 NOTE — ED Provider Notes (Signed)
I received this patient in signout from Dr. Preston Fleeting.  She had presented with vaginal bleeding and recent positive pregnancy test.  At time of signout, awaiting completion of lab work and ultrasound to rule out ectopic pregnancy.  Lab work shows normal CBC and BMP, UA without evidence of infection, beta-hCG 828.  Transvaginal ultrasound shows 5-week intrauterine gestational sac with no other findings noted.  This could represent early pregnancy with bleeding during first trimester or incomplete spontaneous abortion.  I discussed threatened miscarriage with the patient and emphasized the importance of following up with OB/GYN clinic next week for repeat ultrasound and beta-hCG level.  Discussed supportive measures including Tylenol only for pain, avoidance of tampons or sexual activity.  Reviewed return precautions including any heavy vaginal bleeding.  She voiced understanding.   Wendy Vincent, Ambrose Finland, MD 12/01/18 [phone]

## 2018-12-01 NOTE — Telephone Encounter (Signed)
The patient called in to schedule an appointment. Communicated with the patient regarding the discharge documents of the ectopic pregnancy. She stated the physician could see anything because she was only 5 weeks. Scheduled the appointment as requested.

## 2018-12-01 NOTE — ED Provider Notes (Signed)
[facility] EMERGENCY DEPARTMENT Provider Note   CSN: [id number] Arrival date & time: 12/01/18  8343    History   Chief Complaint No chief complaint on file.   HPI Wendy Vincent is a 43 y.o. female.   The history is provided by the patient.  She is pregnant with last menses March 19 and comes in with vaginal bleeding today.  She has noted small amount of blood whenever she wipes.  She denies any abdominal pain.  She has not had any nausea or vomiting.  There had been no problems with the pregnancy to this point.  She is gravida 7, para 5 with 1 miscarriage.  No past medical history on file.  There are no active problems to display for this patient.   No past surgical history on file.   OB History    Gravida  1   Para      Term      Preterm      AB      Living        SAB      TAB      Ectopic      Multiple      Live Births               Home Medications    Prior to Admission medications   Medication Sig Start Date End Date Taking? Authorizing Provider  ibuprofen (ADVIL,MOTRIN) 600 MG tablet Take 1 tablet (600 mg total) by mouth every 6 (six) hours as needed. 12/26/13   Vale Haven, MD  ondansetron (ZOFRAN ODT) 4 MG disintegrating tablet Take 1 tablet (4 mg total) by mouth every 8 (eight) hours as needed for nausea or vomiting. 06/22/17   Georgiana Shore, PA-C    Family History No family history on file.  Social History Social History   Tobacco Use  . Smoking status: Current Every Day Smoker    Packs/day: 0.50    Types: Cigarettes  Substance Use Topics  . Alcohol use: No  . Drug use: No     Allergies   Patient has no known allergies.   Review of Systems Review of Systems  All other systems reviewed and are negative.    Physical Exam Updated Vital Signs BP 129/78 (BP Location: Right Arm)   Pulse 96   Temp 98.3 F (36.8 C) (Oral)   Resp 18   Ht 5\' 6"  (1.676 m)   LMP 10/13/2018   SpO2 100%   BMI 27.44 kg/m   Physical Exam Vitals signs and nursing note reviewed.    43 year old female, resting comfortably and in no acute distress. Vital signs are normal. Oxygen saturation is 100%, which is normal. Head is normocephalic and atraumatic. PERRLA, EOMI. Oropharynx is clear. Neck is nontender and supple without adenopathy or JVD. Back is nontender and there is no CVA tenderness. Lungs are clear without rales, wheezes, or rhonchi. Chest is nontender. Heart has regular rate and rhythm without murmur. Abdomen is soft, flat, nontender without masses or hepatosplenomegaly and peristalsis is normoactive. Pelvic: Normal external female genitalia.  Small amount of blood present in the vaginal vault, but cervix is closed.  No adnexal masses or tenderness.  Fundus difficult to palpate due to body habitus.. Extremities have no cyanosis or edema, full range of motion is present. Skin is warm and dry without rash. Neurologic: Mental status is normal, cranial nerves are intact, there are no motor or sensory deficits.  ED Treatments /  Results  Labs (all labs ordered are listed, but only abnormal results are displayed) Labs Reviewed  WET PREP, GENITAL  CBC WITH DIFFERENTIAL/PLATELET  BASIC METABOLIC PANEL  RPR  HIV ANTIBODY (ROUTINE TESTING W REFLEX)  URINALYSIS, ROUTINE W REFLEX MICROSCOPIC  HCG, QUANTITATIVE, PREGNANCY  GC/CHLAMYDIA PROBE AMP ([place]) NOT [facility]     Radiology No results found.  Procedures Procedures  Medications Ordered in ED Medications - No data to display   Initial Impression / Assessment and Plan / ED Course  I have reviewed the triage vital signs and the nursing notes.  Pertinent labs & imaging results that were available during my care of the patient were reviewed by me and considered in my medical decision making (see chart for details).  First trimester bleeding.  Possible threatened miscarriage, possible ectopic pregnancy.  She will be sent for pelvic ultrasound  to rule out ectopic pregnancy.  Old records are reviewed showing prior miscarriage, blood type O+.  Case is signed out to Dr. Clarene DukeLittle.  Final Clinical Impressions(s) / ED Diagnoses   Final diagnoses:  None    ED Discharge Orders    None       Dione BoozeGlick, Macario Shear, MD 12/01/18 0700

## 2018-12-02 LAB — HIV ANTIBODY (ROUTINE TESTING W REFLEX): HIV Screen 4th Generation wRfx: NONREACTIVE

## 2018-12-02 LAB — RPR, QUANT+TP ABS (REFLEX)
Rapid Plasma Reagin, Quant: 1:1 {titer} — ABNORMAL HIGH
T Pallidum Abs: NONREACTIVE

## 2018-12-02 LAB — RPR: RPR Ser Ql: REACTIVE — AB

## 2018-12-02 LAB — GC/CHLAMYDIA PROBE AMP (~~LOC~~) NOT AT ARMC
Chlamydia: NEGATIVE
Neisseria Gonorrhea: NEGATIVE

## 2018-12-05 ENCOUNTER — Other Ambulatory Visit: Payer: Self-pay

## 2018-12-05 ENCOUNTER — Encounter (HOSPITAL_BASED_OUTPATIENT_CLINIC_OR_DEPARTMENT_OTHER): Payer: Self-pay | Admitting: *Deleted

## 2018-12-05 ENCOUNTER — Emergency Department (HOSPITAL_BASED_OUTPATIENT_CLINIC_OR_DEPARTMENT_OTHER)
Admission: EM | Admit: 2018-12-05 | Discharge: 2018-12-05 | Disposition: A | Discharge status: Home / Self Care | Payer: Self-pay | Attending: Emergency Medicine | Admitting: Emergency Medicine

## 2018-12-05 DIAGNOSIS — O99331 Smoking (tobacco) complicating pregnancy, first trimester: Secondary | ICD-10-CM | POA: Insufficient documentation

## 2018-12-05 DIAGNOSIS — Z3A01 Less than 8 weeks gestation of pregnancy: Secondary | ICD-10-CM | POA: Insufficient documentation

## 2018-12-05 DIAGNOSIS — F17210 Nicotine dependence, cigarettes, uncomplicated: Secondary | ICD-10-CM | POA: Insufficient documentation

## 2018-12-05 DIAGNOSIS — O039 Complete or unspecified spontaneous abortion without complication: Secondary | ICD-10-CM | POA: Insufficient documentation

## 2018-12-05 DIAGNOSIS — N939 Abnormal uterine and vaginal bleeding, unspecified: Secondary | ICD-10-CM

## 2018-12-05 LAB — HCG, QUANTITATIVE, PREGNANCY: hCG, Beta Chain, Quant, S: 589 m[IU]/mL — ABNORMAL HIGH (ref <5)

## 2018-12-05 MED ORDER — KETOROLAC TROMETHAMINE 60 MG/2ML IM SOLN
60.0000 mg | Freq: Once | INTRAMUSCULAR | Status: AC
  Administered 2018-12-05: 60 mg via INTRAMUSCULAR
  Filled 2018-12-05: qty 2

## 2018-12-05 NOTE — ED Provider Notes (Signed)
[facility] EMERGENCY DEPARTMENT Provider Note   CSN: [id number] Arrival date & time: 12/05/18  1014    History   Chief Complaint Chief Complaint  Patient presents with  . Vaginal Bleeding    HPI Wendy Vincent is a 43 y.o. female.     Patient is a 43 year old G7 P5-0-1-5 who is presenting today with worsening vaginal bleeding.  Patient was seen 4 days ago for vaginal spotting and at that time had a hCG of 800 and ultrasound that showed approximately 5-week pregnancy with gestational sac.  Patient states yesterday she started having much heavier bleeding more like a menses with clotting and lower abdominal cramping.  She denies any nausea or vomiting, fever or rashes.  She has had no other vaginal discharge or complaints.   Vaginal Bleeding    History reviewed. No pertinent past medical history.  There are no active problems to display for this patient.   History reviewed. No pertinent surgical history.   OB History    Gravida  7   Para  5   Term      Preterm      AB  1   Living        SAB  1   TAB      Ectopic      Multiple      Live Births  5            Home Medications    Prior to Admission medications   Medication Sig Start Date End Date Taking? Authorizing Provider  ibuprofen (ADVIL,MOTRIN) 600 MG tablet Take 1 tablet (600 mg total) by mouth every 6 (six) hours as needed. 12/26/13   Vale Haven, MD  ondansetron (ZOFRAN ODT) 4 MG disintegrating tablet Take 1 tablet (4 mg total) by mouth every 8 (eight) hours as needed for nausea or vomiting. 06/22/17   Georgiana Shore, PA-C    Family History History reviewed. No pertinent family history.  Social History Social History   Tobacco Use  . Smoking status: Current Every Day Smoker    Packs/day: 0.50    Types: Cigarettes  . Smokeless tobacco: Never Used  Substance Use Topics  . Alcohol use: No  . Drug use: No     Allergies   Patient has no known allergies.   Review  of Systems Review of Systems  Genitourinary: Positive for vaginal bleeding.  All other systems reviewed and are negative.    Physical Exam Updated Vital Signs BP 132/71 (BP Location: Right Arm)   Pulse 90   Temp 98.7 F (37.1 C) (Oral)   Resp 18   Ht 5\' 6"  (1.676 m)   Wt 81.6 kg   LMP 10/13/2018   SpO2 100%   BMI 29.05 kg/m   Physical Exam Vitals signs and nursing note reviewed.  Constitutional:      General: She is not in acute distress.    Appearance: She is well-developed.  HENT:     Head: Normocephalic and atraumatic.  Eyes:     Pupils: Pupils are equal, round, and reactive to light.  Cardiovascular:     Rate and Rhythm: Normal rate and regular rhythm.     Heart sounds: Normal heart sounds. No murmur. No friction rub.  Pulmonary:     Effort: Pulmonary effort is normal.     Breath sounds: Normal breath sounds. No wheezing or rales.  Abdominal:     General: Bowel sounds are normal. There is no distension.  Palpations: Abdomen is soft.     Tenderness: There is abdominal tenderness in the suprapubic area. There is no guarding or rebound.  Musculoskeletal: Normal range of motion.        General: No tenderness.     Comments: No edema  Skin:    General: Skin is warm and dry.     Findings: No rash.  Neurological:     General: No focal deficit present.     Mental Status: She is alert and oriented to person, place, and time. Mental status is at baseline.     Cranial Nerves: No cranial nerve deficit.  Psychiatric:        Mood and Affect: Mood normal.        Behavior: Behavior normal.        Thought Content: Thought content normal.      ED Treatments / Results  Labs (all labs ordered are listed, but only abnormal results are displayed) Labs Reviewed  HCG, QUANTITATIVE, PREGNANCY - Abnormal; Notable for the following components:      Result Value   hCG, Beta Chain, Quant, S 589 (*)    All other components within normal limits    EKG None  Radiology No  results found.  Procedures Procedures (including critical care time)  Medications Ordered in ED Medications - No data to display   Initial Impression / Assessment and Plan / ED Course  I have reviewed the triage vital signs and the nursing notes.  Pertinent labs & imaging results that were available during my care of the patient were reviewed by me and considered in my medical decision making (see chart for details).        Patient presenting today for worsening vaginal bleeding in the setting of known pregnancy approximately 5 weeks and hCG 4 days ago of 800.  Concerned for miscarriage at this time and will repeat hCG.  Patient's RPR results from 4 days ago came back reactive and when they did quant and T. pallidum it came back the quant was 121 but pallidum was nonreactive.  Discussing with the patient she has had the same sexual partner for the last 10 years and has not been with anyone else.  She has not had vaginal discharge itching or prior STI.  She has not had rashes or other concerning symptoms for syphilis.  Husband has also been asymptomatic.  Discussed with Dr. Orvan Falconerampbell and given patient is at low risk and T. pallidum negative this is most likely a false positive.  11:28 AM Patient's quant is decreasing and now in the 500s from 800.  Given patient's bleeding is now more severe and hCG levels are dropping with ultrasound 4 days ago that showed a 5-week 3-day gestation feel that patient is miscarrying.  She will need serial hCG and an ultrasound in approximately 6 days if still having symptoms.  Patient given follow-up instructions with women[facility] outpt clinic  Final Clinical Impressions(s) / ED Diagnoses   Final diagnoses:  Vaginal bleeding  Miscarriage    ED Discharge Orders    None       Gwyneth SproutPlunkett, Marchell Froman, MD 12/05/18 1135

## 2018-12-05 NOTE — ED Triage Notes (Signed)
Pt reports ongoing vaginal bleeding since being seen here on Thursday with + preg test. Pt states she has used 2 pads so far today, and has mild cramping to her pelvic area.

## 2019-01-04 ENCOUNTER — Telehealth: Payer: Self-pay | Admitting: Family Medicine

## 2019-01-04 NOTE — Telephone Encounter (Signed)
Attempted to call patient to ensure that she had the mychart app downloaded and had access to it for her virtual visit on 6/11 @ 1:15. No answer, left a detailed message for the patient to make sure she has the app downloaded before her appointment tomorrow and she had access to it. Advised to give the office a call if she cannot get the app downloaded

## 2019-01-05 ENCOUNTER — Telehealth: Payer: Self-pay | Admitting: General Practice

## 2019-01-05 ENCOUNTER — Other Ambulatory Visit: Payer: Self-pay

## 2019-01-05 DIAGNOSIS — Z3400 Encounter for supervision of normal first pregnancy, unspecified trimester: Secondary | ICD-10-CM

## 2019-01-05 NOTE — Progress Notes (Signed)
1319-- Called patient, no answer- left message stating we are calling for your appointment, please give [place] a call back as soon as possible.  1339-- Called patient for second attempt, no answer- left message stating we are calling for your appointment, please call our front office staff to reschedule. Appt will be rescheduled. Patient also needs new OB appt scheduled.

## 2019-01-06 ENCOUNTER — Telehealth: Payer: Self-pay | Admitting: Family Medicine

## 2019-01-06 ENCOUNTER — Encounter: Payer: Self-pay | Admitting: Family Medicine

## 2019-01-06 NOTE — Telephone Encounter (Signed)
Attempted to call patient to get her rescheduled for her missed new ob intake appointment. No answer, left a voicemail for patient to give the office a call back to be rescheduled for the appointment.

## 2019-03-02 ENCOUNTER — Other Ambulatory Visit (HOSPITAL_COMMUNITY): Payer: Self-pay | Admitting: Family

## 2019-03-02 DIAGNOSIS — Z3A13 13 weeks gestation of pregnancy: Secondary | ICD-10-CM

## 2019-03-02 DIAGNOSIS — Z3682 Encounter for antenatal screening for nuchal translucency: Secondary | ICD-10-CM

## 2019-03-02 DIAGNOSIS — O09521 Supervision of elderly multigravida, first trimester: Secondary | ICD-10-CM

## 2019-03-02 LAB — OB RESULTS CONSOLE HIV ANTIBODY (ROUTINE TESTING): HIV: NONREACTIVE

## 2019-03-02 LAB — OB RESULTS CONSOLE PLATELET COUNT: Platelets: 294

## 2019-03-02 LAB — OB RESULTS CONSOLE ABO/RH: "RH Type ": POSITIVE

## 2019-03-02 LAB — CYSTIC FIBROSIS MUTATION 97
Cystic Fibrosis Profile: NEGATIVE
Drug Screen, Urine: NEGATIVE
Glucose 1 Hour: 161
Urine Culture, OB: NEGATIVE

## 2019-03-02 LAB — OB RESULTS CONSOLE GC/CHLAMYDIA
Chlamydia: NEGATIVE
Gonorrhea: NEGATIVE

## 2019-03-02 LAB — HEMOGLOBIN EVAL RFX ELECTROPHORESIS: Hemoglobin Evaluation: NORMAL

## 2019-03-02 LAB — OB RESULTS CONSOLE ANTIBODY SCREEN: Antibody Screen: NEGATIVE

## 2019-03-02 LAB — OB RESULTS CONSOLE HEPATITIS B SURFACE ANTIGEN: Hepatitis B Surface Ag: NEGATIVE

## 2019-03-02 LAB — OB RESULTS CONSOLE RUBELLA ANTIBODY, IGM: Rubella: IMMUNE

## 2019-03-02 LAB — OB RESULTS CONSOLE HGB/HCT, BLOOD
HCT: 39 (ref 29–41)
Hemoglobin: 12.6

## 2019-03-02 LAB — OB RESULTS CONSOLE RPR: RPR: NONREACTIVE

## 2019-03-06 ENCOUNTER — Encounter (HOSPITAL_COMMUNITY): Payer: Self-pay | Admitting: *Deleted

## 2019-03-06 LAB — GLUCOSE, 3 HOUR GESTATIONAL
Glucose 1 Hour: 180
Glucose 2 Hour: 110
Glucose 3 Hour: 85
Glucose Tolerance, Fasting: 84 (ref 70–99)

## 2019-03-09 ENCOUNTER — Ambulatory Visit (HOSPITAL_COMMUNITY): Payer: Self-pay | Admitting: Family

## 2019-03-09 ENCOUNTER — Encounter (HOSPITAL_COMMUNITY): Payer: Self-pay

## 2019-03-09 ENCOUNTER — Other Ambulatory Visit: Payer: Self-pay

## 2019-03-09 ENCOUNTER — Other Ambulatory Visit (HOSPITAL_COMMUNITY): Payer: Self-pay | Admitting: Family

## 2019-03-09 ENCOUNTER — Ambulatory Visit (HOSPITAL_COMMUNITY): Payer: Self-pay | Admitting: Obstetrics and Gynecology

## 2019-03-09 ENCOUNTER — Ambulatory Visit (HOSPITAL_COMMUNITY): Payer: Medicaid Other

## 2019-03-09 ENCOUNTER — Ambulatory Visit (HOSPITAL_COMMUNITY)
Admission: RE | Admit: 2019-03-09 | Discharge: 2019-03-09 | Disposition: A | Discharge status: Home / Self Care | Payer: Medicaid Other | Source: Ambulatory Visit | Attending: Obstetrics and Gynecology | Admitting: Obstetrics and Gynecology

## 2019-03-09 ENCOUNTER — Ambulatory Visit (HOSPITAL_BASED_OUTPATIENT_CLINIC_OR_DEPARTMENT_OTHER): Payer: Medicaid Other | Admitting: Genetic Counselor

## 2019-03-09 ENCOUNTER — Ambulatory Visit (HOSPITAL_COMMUNITY): Payer: Medicaid Other | Admitting: *Deleted

## 2019-03-09 VITALS — BP 125/72 | HR 79 | Temp 98.6°F | Ht 65.0 in | Wt 194.2 lb

## 2019-03-09 DIAGNOSIS — Z8349 Family history of other endocrine, nutritional and metabolic diseases: Secondary | ICD-10-CM | POA: Insufficient documentation

## 2019-03-09 DIAGNOSIS — Z3682 Encounter for antenatal screening for nuchal translucency: Secondary | ICD-10-CM

## 2019-03-09 DIAGNOSIS — Z3A13 13 weeks gestation of pregnancy: Secondary | ICD-10-CM | POA: Diagnosis not present

## 2019-03-09 DIAGNOSIS — O09291 Supervision of pregnancy with other poor reproductive or obstetric history, first trimester: Secondary | ICD-10-CM

## 2019-03-09 DIAGNOSIS — O09529 Supervision of elderly multigravida, unspecified trimester: Secondary | ICD-10-CM | POA: Insufficient documentation

## 2019-03-09 DIAGNOSIS — Z315 Encounter for genetic counseling: Secondary | ICD-10-CM

## 2019-03-09 DIAGNOSIS — O99331 Smoking (tobacco) complicating pregnancy, first trimester: Secondary | ICD-10-CM

## 2019-03-09 DIAGNOSIS — O09521 Supervision of elderly multigravida, first trimester: Secondary | ICD-10-CM | POA: Diagnosis present

## 2019-03-09 DIAGNOSIS — O09211 Supervision of pregnancy with history of pre-term labor, first trimester: Secondary | ICD-10-CM

## 2019-03-10 NOTE — Progress Notes (Signed)
03/09/2019  Unknown Flannigan Sep 09, 1975 MRN: [id number] DOV: 03/09/2019  Ms. 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 Ms. Tilmon came to her appointment alone due to COVID-19 visitor restrictions.   Indication for genetic counseling - Advanced maternal age (AMA) - Previous child with primordial dwarfism   Prenatal history  Ms. Woodell is a G8P418, 43 y.o. year old female. Her current pregnancy has completed [redacted]w[redacted]d(Estimated Date of Delivery: 09/08/19).  Ms. BHairedenied exposure to environmental toxins or chemical agents. She denied the use of alcohol or street drugs. She reported smoking 7 cigarettes per day, down from a pack per day. She denied significant viral illnesses, fevers, and bleeding during the course of her pregnancy. Her medical and surgical histories were noncontributory.  Prenatal exposure to tobacco can increase the risk for placenta previa and placental abruption, preterm birth, low birth weight, oral clefts, stillbirth, and sudden infant death syndrome (SIDS). Prenatal exposure to nicotine can also damage the developing fetal brain and lungs. The risk of pregnancy complications associated with cigarette exposure tends to increase with the amount of cigarettes a woman smokes. It is never too late to quit smoking, and Ms. BShidleris working hard to reduce the number of cigarettes smoked per day with the goal to eventually quit smoking altogether.  Family History  A three generation pedigree was drafted and reviewed. The family history is remarkable for the following:  - Ms. BFredlundhas a previous child, DShanon Brow who has primordial dwarfism. Aside from short stature, DShanon Browhas no other medical or developmental complications. DShanon Browhas a paternal half-sister ALevora Dredgewho also has primordial dwarfism. Upon genetic investigation for the cause  of Amirasady's primordial dwarfism, it was determined that David's father, DMartie Round, carries a 21q microdeletion. This microdeletion is likely clinically insignificant for DMartie Round given that he reportedly has not had any medical or developmental complications. DGenoveva Ill has had a negative chromosomal microarray. Thus, the genetic cause for the primordial dwarfism in this family is yet to be identified. We discussed that there could be up to a 50% chance to have another child with primordial dwarfism with each pregnancy since a precise genetic cause is not yet known. We also discussed that there is a 50% chance that each of Ms. Speranza's children with DMartie Round will inherit the 21q microdeletion he carries; however, clinical implications associated with this microdeletion are unclear. Ms. BReaumeis receptive to a follow-up appointment in genetics for DGenoveva Ill given that he has not been seen since 2013. [facility] will work with David's primary care pediatrician to schedule him for follow-up in the genetics clinic.  - Ms. BShanreports that a son from a previous relationship, Anjel, had short stature until age 41 He is reportedly average height now. Ms. BLaurichdid not report short stature for anyone else in the family.  The remaining family histories were reviewed and found to be noncontributory for birth defects, intellectual disability, recurrent pregnancy loss, and known genetic conditions.    The patient's ethnicity is [place] The father of the pregnancy's ethnicity is [place] Ashkenazi Jewish ancestry and consanguinity were denied. Pedigree will be scanned under Media.  Discussion  Ms. BCurrinwas referred to genetic counseling for advanced maternal age, as she will be 43years old at the time of delivery. We reviewed that at her age and during  the first trimester, there is approximately a 1 in 22 (4.5%) chance of having a child with a chromosomal abnormality.  Her age-related risk to have a child with Down syndrome specifically is 1 in 43 (2.3%) in the first trimester. We briefly discussed features of trisomy 23 (Down syndrome), trisomy 101, and trisomy 63. These conditions often are not inherited, but instead occur due to an error in chromosomal division during the formation of sperm and egg cells.   We reviewed noninvasive prenatal screening (NIPS) as an available screening option. Specifically, we discussed that NIPS analyzes cell free fetal DNA found in the maternal blood circulation during pregnancy. This test is not diagnostic for chromosome conditions, but can provide information regarding the presence or absence of extra fetal DNA for chromosomes 13, 18 and 21. Thus, it would not identify or rule out all fetal aneuploidy. The reported detection rate is greater than 99% for trisomy 32, trisomy 57, and trisomy 67. The false positive rate is reported to be less than 0.1% for any of these conditions. Ms.Boghosian indicated that she is interested in pursuing NIPS.    Per ACOG recommendation, carrier screening for hemoglobinopathies, Cystic Fibrosis (CF) and Spinal Muscular Atrophy (SMA) was discussed including information about the conditions, rationale for testing, autosomal recessive inheritance, and the option of prenatal diagnosis. The patient was informed that select hemoglobinopathies and CF are included on [place]'s newborn screen. Ms. Chesmore indicated that she is interested in pursuing carrier screening for CF, SMA, and hemoglobinopathies.  A limited ultrasound was performed today prior to our visit. The ultrasound report will be sent under separate cover. There were no visualized fetal markers suggestive of aneuploidy.   Ms. Bressi was also counseled regarding the option of diagnostic testing via chorionic villus sampling (CVS) or amniocentesis . We discussed the technical aspects of each procedure and quoted up to a 1 in 500 (0.2%) risk for  spontaneous pregnancy loss or other adverse pregnancy outcomes as a result of either procedure. Cultured cells from either a placental or amniotic fluid sample allow for the visualization of a fetal karyotype, which can detect >99% of chromosomal aberrations. Chromosomal microarray can also be performed to identify smaller deletions or duplications of fetal chromosomal material. After careful consideration, Ms. Womac declined diagnostic testing at this time.  Lastly, the patient was made aware that screening for open neural tube defects (ONTDs) via MS-AFP in the second trimester in addition to level II ultrasound examination is recommended. Level II ultrasound and MS-AFP are able to detect ONTDs with 90-95% sensitivity. However, normal results from any of the above options do not guarantee a normal baby, as 3-5% of newborns have some type of birth defect, many of which are not prenatally diagnosable.  Given that Ms. Simmonds is currently uninsured, we ordered NIPS and carrier screening through [place]. We applied for the Patient Assistance Program, as she qualifies for free testing. I helped Ms. Okey Dupre schedule a blood draw appointment via [employer] [facility]. A phlebotomist will visit her house on 8/17 to collect samples for NIPS and carrier screening. Ms. Bergren was given an [facility] NIPS collection kit and an [facility] blood collection kit for carrier screening. Results from NIPS and carrier screening will be returned 10-21 days from the date of sample collection. I will call Ms. Rane with the results when they become available.  I counseled Ms. Kataoka regarding the above risks and available options. The approximate face-to-face time with the genetic counselor was 50 minutes.  In summary:  Discussed age-related risks for chromosomal conditions and options for follow-up testing ? Opted to undergo NIPS. We will follow results  Discussed carrier screening for CF, SMA, &  hemoglobinopathies ? Opted to undergo carrier screening. We will follow results  Reviewed results of ultrasound ? No markers seen  Offered additional testing and screening ? Declined CVS  ? Recommend MS-AFP screening  Reviewed family history concerns ? Up to 50% risk to have another child with primordial dwarfism   Buelah Manis, [place] Counselor

## 2019-03-27 ENCOUNTER — Telehealth (HOSPITAL_COMMUNITY): Payer: Self-pay | Admitting: Genetic Counselor

## 2019-03-27 NOTE — Telephone Encounter (Signed)
I called Wendy Vincent to discuss her negative noninvasive prenatal screening (NIPS)/cell free DNA (cfDNA) testing result. Wendy Vincent daughter Wendy Vincent helped serve as an interpreter. Wendy Vincent had NIPS testing through [facility]. Testing was offered because of advanced maternal age. These negative results demonstrated an expected representation of chromosome 21, 18, and 13 material and material from the sex chromosomes, greatly reducing the likelihood of trisomies 89, 37, or 2 or sex chromosome aneuploidies for the pregnancy. Wendy Vincent requested to know about the expected fetal sex, which was female.  NIPS analyzes placental (fetal) DNA in maternal circulation. NIPS is considered to be highly specific and sensitive, but is not considered to be a diagnostic test. We reviewed that this testing identifies the majority of pregnancies with trisomies 43, 17, and 50, as well as the sex of the baby. Diagnostic testing via amniocentesis is available should she be interested in confirming this result. She confirmed that she had no questions about these results at this time.  Results from Wendy Vincent's carrier screening are still pending. I will call her when results become available.  Buelah Manis, MS Genetic Counselor

## 2019-03-29 ENCOUNTER — Other Ambulatory Visit (HOSPITAL_COMMUNITY): Payer: Self-pay | Admitting: Family

## 2019-03-30 ENCOUNTER — Telehealth (HOSPITAL_COMMUNITY): Payer: Self-pay | Admitting: Genetic Counselor

## 2019-03-30 NOTE — Telephone Encounter (Signed)
LVM for Ms. Nilsson re: good news about carrier screening results. Requested a call back to my direct line to discuss these in more detail, as no identifiers were provided in voicemail message.   Buelah Manis, MS Genetic Counselor

## 2019-04-27 ENCOUNTER — Encounter (HOSPITAL_COMMUNITY): Payer: Self-pay

## 2019-05-04 ENCOUNTER — Other Ambulatory Visit (HOSPITAL_COMMUNITY): Payer: Self-pay | Admitting: Obstetrics & Gynecology

## 2019-05-04 DIAGNOSIS — Z3689 Encounter for other specified antenatal screening: Secondary | ICD-10-CM

## 2019-05-04 DIAGNOSIS — Z3A24 24 weeks gestation of pregnancy: Secondary | ICD-10-CM

## 2019-05-25 ENCOUNTER — Other Ambulatory Visit: Payer: Self-pay

## 2019-05-25 ENCOUNTER — Ambulatory Visit (HOSPITAL_COMMUNITY): Payer: Medicaid Other | Admitting: *Deleted

## 2019-05-25 ENCOUNTER — Ambulatory Visit (HOSPITAL_COMMUNITY)
Admission: RE | Admit: 2019-05-25 | Discharge: 2019-05-25 | Disposition: A | Discharge status: Home / Self Care | Payer: Medicaid Other | Source: Ambulatory Visit | Attending: Obstetrics and Gynecology | Admitting: Obstetrics and Gynecology

## 2019-05-25 ENCOUNTER — Other Ambulatory Visit (HOSPITAL_COMMUNITY): Payer: Self-pay | Admitting: Obstetrics & Gynecology

## 2019-05-25 ENCOUNTER — Encounter (HOSPITAL_COMMUNITY): Payer: Self-pay

## 2019-05-25 ENCOUNTER — Encounter (HOSPITAL_COMMUNITY): Payer: Self-pay | Admitting: *Deleted

## 2019-05-25 VITALS — BP 124/69 | HR 91 | Temp 98.8°F

## 2019-05-25 DIAGNOSIS — O283 Abnormal ultrasonic finding on antenatal screening of mother: Secondary | ICD-10-CM

## 2019-05-25 DIAGNOSIS — Z3A24 24 weeks gestation of pregnancy: Secondary | ICD-10-CM | POA: Diagnosis not present

## 2019-05-25 DIAGNOSIS — F17210 Nicotine dependence, cigarettes, uncomplicated: Secondary | ICD-10-CM | POA: Insufficient documentation

## 2019-05-25 DIAGNOSIS — O365920 Maternal care for other known or suspected poor fetal growth, second trimester, not applicable or unspecified: Secondary | ICD-10-CM | POA: Diagnosis not present

## 2019-05-25 DIAGNOSIS — O09212 Supervision of pregnancy with history of pre-term labor, second trimester: Secondary | ICD-10-CM | POA: Diagnosis not present

## 2019-05-25 DIAGNOSIS — O99331 Smoking (tobacco) complicating pregnancy, first trimester: Secondary | ICD-10-CM | POA: Diagnosis not present

## 2019-05-25 DIAGNOSIS — Z3682 Encounter for antenatal screening for nuchal translucency: Secondary | ICD-10-CM | POA: Diagnosis not present

## 2019-05-25 DIAGNOSIS — O09292 Supervision of pregnancy with other poor reproductive or obstetric history, second trimester: Secondary | ICD-10-CM

## 2019-05-25 DIAGNOSIS — O365990 Maternal care for other known or suspected poor fetal growth, unspecified trimester, not applicable or unspecified: Secondary | ICD-10-CM | POA: Insufficient documentation

## 2019-05-25 DIAGNOSIS — Z3689 Encounter for other specified antenatal screening: Secondary | ICD-10-CM | POA: Insufficient documentation

## 2019-05-25 DIAGNOSIS — O09521 Supervision of elderly multigravida, first trimester: Secondary | ICD-10-CM

## 2019-05-25 DIAGNOSIS — Z363 Encounter for antenatal screening for malformations: Secondary | ICD-10-CM | POA: Diagnosis not present

## 2019-05-25 DIAGNOSIS — O09522 Supervision of elderly multigravida, second trimester: Secondary | ICD-10-CM | POA: Insufficient documentation

## 2019-05-25 DIAGNOSIS — Z8279 Family history of other congenital malformations, deformations and chromosomal abnormalities: Secondary | ICD-10-CM | POA: Diagnosis not present

## 2019-05-25 NOTE — Procedures (Signed)
Wendy Vincent 30-Nov-1975 [redacted]w[redacted]d  Fetus A Non-Stress Test Interpretation for 05/25/19  Indication: Failed Dopplers  Fetal Heart Rate A Mode: External Baseline Rate (A): 145 bpm Variability: Moderate Accelerations: None Decelerations: None Multiple birth?: No  Uterine Activity Mode: Palpation, Toco Contraction Frequency (min): None Resting Tone Palpated: Relaxed Resting Time: Adequate  Interpretation (Fetal Testing) Overall Impression: Reassuring for gestational age Comments: Very difficult to monitor. Cardio held manually in attempt to obtain continuous tracing. Fetus very active. Tracing reviewed by Dr. Donalee Citrin.

## 2019-05-26 ENCOUNTER — Other Ambulatory Visit (HOSPITAL_COMMUNITY): Payer: Self-pay | Admitting: *Deleted

## 2019-05-26 DIAGNOSIS — O365990 Maternal care for other known or suspected poor fetal growth, unspecified trimester, not applicable or unspecified: Secondary | ICD-10-CM

## 2019-05-29 ENCOUNTER — Encounter: Payer: Self-pay | Admitting: Family Medicine

## 2019-05-31 ENCOUNTER — Encounter: Payer: Self-pay | Admitting: *Deleted

## 2019-06-01 ENCOUNTER — Encounter (HOSPITAL_COMMUNITY): Payer: Self-pay

## 2019-06-01 ENCOUNTER — Ambulatory Visit (HOSPITAL_COMMUNITY): Payer: Medicaid Other | Admitting: *Deleted

## 2019-06-01 ENCOUNTER — Other Ambulatory Visit: Payer: Self-pay

## 2019-06-01 ENCOUNTER — Ambulatory Visit (HOSPITAL_COMMUNITY)
Admission: RE | Admit: 2019-06-01 | Discharge: 2019-06-01 | Disposition: A | Discharge status: Home / Self Care | Payer: Medicaid Other | Source: Ambulatory Visit | Attending: Obstetrics and Gynecology | Admitting: Obstetrics and Gynecology

## 2019-06-01 VITALS — BP 133/70 | HR 88 | Temp 98.3°F

## 2019-06-01 DIAGNOSIS — O09522 Supervision of elderly multigravida, second trimester: Secondary | ICD-10-CM

## 2019-06-01 DIAGNOSIS — Z3A25 25 weeks gestation of pregnancy: Secondary | ICD-10-CM

## 2019-06-01 DIAGNOSIS — O09292 Supervision of pregnancy with other poor reproductive or obstetric history, second trimester: Secondary | ICD-10-CM

## 2019-06-01 DIAGNOSIS — O09529 Supervision of elderly multigravida, unspecified trimester: Secondary | ICD-10-CM

## 2019-06-01 DIAGNOSIS — O365990 Maternal care for other known or suspected poor fetal growth, unspecified trimester, not applicable or unspecified: Secondary | ICD-10-CM | POA: Diagnosis present

## 2019-06-01 DIAGNOSIS — O09212 Supervision of pregnancy with history of pre-term labor, second trimester: Secondary | ICD-10-CM | POA: Diagnosis not present

## 2019-06-01 DIAGNOSIS — O99332 Smoking (tobacco) complicating pregnancy, second trimester: Secondary | ICD-10-CM

## 2019-06-01 DIAGNOSIS — O365920 Maternal care for other known or suspected poor fetal growth, second trimester, not applicable or unspecified: Secondary | ICD-10-CM

## 2019-06-01 NOTE — Procedures (Signed)
Wendy Vincent Jul 15, 1976 [redacted]w[redacted]d  Fetus A Non-Stress Test Interpretation for 06/01/19  Indication: IUGR  Fetal Heart Rate A Mode: External Baseline Rate (A): 145 bpm Variability: Moderate Accelerations: None Decelerations: None Multiple birth?: No  Uterine Activity Mode: Toco Contraction Frequency (min): none noted  Interpretation (Fetal Testing) Comments: Fetus very active, difficult to monitor.  Cardio held manually at times. No obvious decels noted.  FHR tracing rev'd by Dr. Donalee Citrin

## 2019-06-06 ENCOUNTER — Ambulatory Visit (HOSPITAL_COMMUNITY): Payer: Self-pay

## 2019-06-08 ENCOUNTER — Ambulatory Visit (HOSPITAL_COMMUNITY)
Admission: RE | Admit: 2019-06-08 | Discharge: 2019-06-08 | Disposition: A | Discharge status: Home / Self Care | Payer: Medicaid Other | Source: Ambulatory Visit | Attending: Obstetrics and Gynecology | Admitting: Obstetrics and Gynecology

## 2019-06-08 ENCOUNTER — Ambulatory Visit (HOSPITAL_COMMUNITY): Payer: Medicaid Other | Admitting: *Deleted

## 2019-06-08 ENCOUNTER — Encounter (HOSPITAL_COMMUNITY): Payer: Self-pay | Admitting: Pediatrics

## 2019-06-08 ENCOUNTER — Other Ambulatory Visit: Payer: Self-pay

## 2019-06-08 ENCOUNTER — Encounter (HOSPITAL_COMMUNITY): Payer: Self-pay

## 2019-06-08 VITALS — BP 125/70 | HR 98 | Temp 98.4°F

## 2019-06-08 DIAGNOSIS — O09529 Supervision of elderly multigravida, unspecified trimester: Secondary | ICD-10-CM | POA: Diagnosis present

## 2019-06-08 DIAGNOSIS — O09292 Supervision of pregnancy with other poor reproductive or obstetric history, second trimester: Secondary | ICD-10-CM | POA: Diagnosis not present

## 2019-06-08 DIAGNOSIS — O365990 Maternal care for other known or suspected poor fetal growth, unspecified trimester, not applicable or unspecified: Secondary | ICD-10-CM | POA: Insufficient documentation

## 2019-06-08 DIAGNOSIS — Z3A26 26 weeks gestation of pregnancy: Secondary | ICD-10-CM

## 2019-06-08 DIAGNOSIS — O365920 Maternal care for other known or suspected poor fetal growth, second trimester, not applicable or unspecified: Secondary | ICD-10-CM

## 2019-06-08 DIAGNOSIS — O09522 Supervision of elderly multigravida, second trimester: Secondary | ICD-10-CM

## 2019-06-08 DIAGNOSIS — O09212 Supervision of pregnancy with history of pre-term labor, second trimester: Secondary | ICD-10-CM

## 2019-06-08 DIAGNOSIS — O99332 Smoking (tobacco) complicating pregnancy, second trimester: Secondary | ICD-10-CM

## 2019-06-08 NOTE — Procedures (Signed)
Wendy Vincent 1975-12-29 [redacted]w[redacted]d  Fetus A Non-Stress Test Interpretation for 06/08/19  Indication: IUGR  Fetal Heart Rate A Mode: External Baseline Rate (A): 140 bpm Variability: Moderate Accelerations: 10 x 10 Decelerations: Variable Multiple birth?: No  Uterine Activity Mode: Toco Contraction Frequency (min): none noted  Interpretation (Fetal Testing) Nonstress Test Interpretation: Reactive Overall Impression: Reassuring for gestational age Comments: Cardio held manually, FHR tracing rev'd by Dr. Donalee Citrin.

## 2019-06-09 ENCOUNTER — Other Ambulatory Visit (HOSPITAL_COMMUNITY): Payer: Self-pay | Admitting: *Deleted

## 2019-06-09 DIAGNOSIS — O365920 Maternal care for other known or suspected poor fetal growth, second trimester, not applicable or unspecified: Secondary | ICD-10-CM

## 2019-06-12 ENCOUNTER — Encounter: Payer: Self-pay | Admitting: *Deleted

## 2019-06-12 DIAGNOSIS — O365930 Maternal care for other known or suspected poor fetal growth, third trimester, not applicable or unspecified: Secondary | ICD-10-CM | POA: Insufficient documentation

## 2019-06-12 DIAGNOSIS — O09529 Supervision of elderly multigravida, unspecified trimester: Secondary | ICD-10-CM | POA: Insufficient documentation

## 2019-06-12 DIAGNOSIS — O365910 Maternal care for other known or suspected poor fetal growth, first trimester, not applicable or unspecified: Secondary | ICD-10-CM

## 2019-06-12 DIAGNOSIS — O09299 Supervision of pregnancy with other poor reproductive or obstetric history, unspecified trimester: Secondary | ICD-10-CM | POA: Insufficient documentation

## 2019-06-12 DIAGNOSIS — Z8632 Personal history of gestational diabetes: Secondary | ICD-10-CM

## 2019-06-12 DIAGNOSIS — F17200 Nicotine dependence, unspecified, uncomplicated: Secondary | ICD-10-CM

## 2019-06-12 DIAGNOSIS — O359XX0 Maternal care for (suspected) fetal abnormality and damage, unspecified, not applicable or unspecified: Secondary | ICD-10-CM | POA: Insufficient documentation

## 2019-06-12 DIAGNOSIS — O0990 Supervision of high risk pregnancy, unspecified, unspecified trimester: Secondary | ICD-10-CM | POA: Insufficient documentation

## 2019-06-12 DIAGNOSIS — O365990 Maternal care for other known or suspected poor fetal growth, unspecified trimester, not applicable or unspecified: Secondary | ICD-10-CM | POA: Insufficient documentation

## 2019-06-12 DIAGNOSIS — O09899 Supervision of other high risk pregnancies, unspecified trimester: Secondary | ICD-10-CM | POA: Insufficient documentation

## 2019-06-12 DIAGNOSIS — Z789 Other specified health status: Secondary | ICD-10-CM

## 2019-06-14 ENCOUNTER — Encounter: Payer: Self-pay | Admitting: Obstetrics and Gynecology

## 2019-06-14 ENCOUNTER — Other Ambulatory Visit: Payer: Self-pay

## 2019-06-14 ENCOUNTER — Ambulatory Visit (INDEPENDENT_AMBULATORY_CARE_PROVIDER_SITE_OTHER): Payer: Medicaid Other | Admitting: Obstetrics and Gynecology

## 2019-06-14 VITALS — BP 136/72 | HR 89 | Wt 201.0 lb

## 2019-06-14 DIAGNOSIS — O99332 Smoking (tobacco) complicating pregnancy, second trimester: Secondary | ICD-10-CM | POA: Diagnosis not present

## 2019-06-14 DIAGNOSIS — F17200 Nicotine dependence, unspecified, uncomplicated: Secondary | ICD-10-CM | POA: Diagnosis not present

## 2019-06-14 DIAGNOSIS — O0993 Supervision of high risk pregnancy, unspecified, third trimester: Secondary | ICD-10-CM

## 2019-06-14 DIAGNOSIS — O09292 Supervision of pregnancy with other poor reproductive or obstetric history, second trimester: Secondary | ICD-10-CM

## 2019-06-14 DIAGNOSIS — O09522 Supervision of elderly multigravida, second trimester: Secondary | ICD-10-CM

## 2019-06-14 DIAGNOSIS — Z23 Encounter for immunization: Secondary | ICD-10-CM | POA: Diagnosis not present

## 2019-06-14 DIAGNOSIS — O365912 Maternal care for other known or suspected poor fetal growth, first trimester, fetus 2: Secondary | ICD-10-CM | POA: Diagnosis not present

## 2019-06-14 DIAGNOSIS — Z3009 Encounter for other general counseling and advice on contraception: Secondary | ICD-10-CM

## 2019-06-14 DIAGNOSIS — O09299 Supervision of pregnancy with other poor reproductive or obstetric history, unspecified trimester: Secondary | ICD-10-CM

## 2019-06-14 DIAGNOSIS — O09523 Supervision of elderly multigravida, third trimester: Secondary | ICD-10-CM

## 2019-06-14 DIAGNOSIS — O365910 Maternal care for other known or suspected poor fetal growth, first trimester, not applicable or unspecified: Secondary | ICD-10-CM

## 2019-06-14 DIAGNOSIS — Z3A27 27 weeks gestation of pregnancy: Secondary | ICD-10-CM | POA: Diagnosis not present

## 2019-06-14 DIAGNOSIS — O09899 Supervision of other high risk pregnancies, unspecified trimester: Secondary | ICD-10-CM

## 2019-06-14 DIAGNOSIS — O0992 Supervision of high risk pregnancy, unspecified, second trimester: Secondary | ICD-10-CM

## 2019-06-14 DIAGNOSIS — O359XX0 Maternal care for (suspected) fetal abnormality and damage, unspecified, not applicable or unspecified: Secondary | ICD-10-CM | POA: Diagnosis not present

## 2019-06-14 NOTE — Progress Notes (Signed)
INITIAL PRENATAL VISIT NOTE  Subjective:  Wendy Vincent is a 43 y.o. [id number] at [redacted]w[redacted]d by LMP being seen today for her initial prenatal visit. She has an obstetric history significant for 5 x SVD, 1 pre-term delivery, induced. She has a medical history significant for gDM.  Patient reports feeling full, fatigue.  Contractions: Not present. Vag. Bleeding: None.  Movement: Present. Denies leaking of fluid.    Past Medical History:  Diagnosis Date  . Gestational diabetes   . Medical history non-contributory   . Preterm labor     Past Surgical History:  Procedure Laterality Date  . NO PAST SURGERIES      OB History  Gravida Para Term Preterm AB Living  8 5 4 1 2 5   SAB TAB Ectopic Multiple Live Births  2       5    # Outcome Date GA Lbr Len/2nd Weight Sex Delivery Anes PTL Lv  8 Gravida           7 SAB 09/2018          6 Term 11/17/14 [redacted]w[redacted]d  8 lb (3.629 kg) M Vag-Spont EPI N LIV     Complications: Gestational diabetes  5 SAB 12/2013          4 Preterm 12/22/09 [redacted]w[redacted]d  4 lb (1.814 kg) M Vag-Spont None Y LIV     Birth Comments: PTL  3 Term 08/26/04 [redacted]w[redacted]d   M Vag-Spont  Y LIV  2 Term 01/01/99 [redacted]w[redacted]d  7 lb 5 oz (3.317 kg) F Vag-Spont None N LIV  1 Term 12/06/95 [redacted]w[redacted]d  7 lb (3.175 kg) M Vag-Spont EPI N LIV    Social History   Socioeconomic History  . Marital status: Single    Spouse name: Not on file  . Number of children: Not on file  . Years of education: Not on file  . Highest education level: Not on file  Occupational History  . Not on file  Social Needs  . Financial resource strain: Not on file  . Food insecurity    Worry: Not on file    Inability: Not on file  . Transportation needs    Medical: Not on file    Non-medical: Not on file  Tobacco Use  . Smoking status: Current Every Day Smoker    Packs/day: 0.50    Types: Cigarettes  . Smokeless tobacco: Never Used  Substance and Sexual Activity  . Alcohol use: No  . Drug use: No  . Sexual activity: Not  Currently    Birth control/protection: None  Lifestyle  . Physical activity    Days per week: Not on file    Minutes per session: Not on file  . Stress: Not on file  Relationships  . Social [redacted]w[redacted]d on phone: Not on file    Gets together: Not on file    Attends religious service: Not on file    Active member of club or organization: Not on file    Attends meetings of clubs or organizations: Not on file    Relationship status: Not on file  Other Topics Concern  . Not on file  Social History Narrative  . Not on file    Family History  Problem Relation Age of Onset  . Heart disease Mother      Current Outpatient Medications:  .  Prenatal Vit-Fe Fumarate-FA (PREPLUS) 27-1 MG TABS, Take 1 tablet by mouth daily., Disp: , Rfl:   No Known  Allergies  Review of Systems: Negative except for what is mentioned in HPI.  Objective:   Vitals:   06/14/19 1321  BP: 136/72  Pulse: 89  Weight: 201 lb (91.2 kg)    Fetal Status: Fetal Heart Rate (bpm): 138   Movement: Present     Physical Exam: BP 136/72   Pulse 89   Wt 201 lb (91.2 kg)   LMP 12/02/2018   Breastfeeding Unknown   BMI 33.45 kg/m  CONSTITUTIONAL: Well-developed, well-nourished female in no acute distress.  NEUROLOGIC: Alert and oriented to person, place, and time. Normal reflexes, muscle tone coordination. No cranial nerve deficit noted. PSYCHIATRIC: Normal mood and affect. Normal behavior. Normal judgment and thought content. SKIN: Skin is warm and dry. No rash noted. Not diaphoretic. No erythema. No pallor. HENT:  Normocephalic, atraumatic, External right and left ear normal. Oropharynx is clear and moist EYES: Conjunctivae and EOM are normal. Pupils are equal, round, and reactive to light. No scleral icterus.  NECK: Normal range of motion, supple, no masses CARDIOVASCULAR: Normal heart rate noted, regular rhythm RESPIRATORY: Effort and breath sounds normal, no problems with respiration noted  BREASTS:deferred ABDOMEN: Soft, nontender, nondistended, gravid. GU: deferred MUSCULOSKELETAL: Normal range of motion. EXT:  No edema and no tenderness. 2+ distal pulses.   Assessment and Plan:  Pregnancy: [id number] at [redacted]w[redacted]d by LMP  1. Supervision of high risk pregnancy in third trimester [place] for [employer] structure, multiple providers, fellows, medical students, virtual visits, MyChart.   2. Multigravida of advanced maternal age in third trimester  3. History of gestational diabetes in prior pregnancy, currently pregnant Elevated 1 hr, normal 3 hr  4. History of preterm delivery, currently pregnant States she was induced at 35 weeks for an "issue with the placenta"  5. Current smoker Smokes 7 cigarettes /day Smoking and tobacco cessation was discussed at today's visit for 5 minutes   6. Poor fetal growth affecting management of mother in first trimester, single or unspecified fetus IUGR, getting weekly dopplers, next one tomorrow, last week was wnl Last growth < 1%  7. 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 Known fetal anomaly, antepartum, single or unspecified fetus Incomplete views of heart on anatomy Normal fetal echo  9. Unwanted fertility Counseled regarding procedure, elective, permanency, she desires BTL Papers signed today   Preterm labor symptoms and general obstetric precautions including but not limited to vaginal bleeding, contractions, leaking of fluid and fetal movement were reviewed in detail with the patient.  Please refer to After Visit Summary for other counseling recommendations.   Return in about 2 weeks (around 06/28/2019) for high OB, in person.  Sloan Leiter 06/14/2019 2:32 PM

## 2019-06-14 NOTE — Progress Notes (Signed)
States she feel extremally full when she eat and cant eat much.

## 2019-06-15 ENCOUNTER — Encounter: Payer: Self-pay | Admitting: *Deleted

## 2019-06-15 ENCOUNTER — Ambulatory Visit (HOSPITAL_COMMUNITY): Payer: Medicaid Other

## 2019-06-15 ENCOUNTER — Ambulatory Visit (HOSPITAL_COMMUNITY): Payer: Medicaid Other | Admitting: *Deleted

## 2019-06-15 ENCOUNTER — Ambulatory Visit (HOSPITAL_COMMUNITY)
Admission: RE | Admit: 2019-06-15 | Discharge: 2019-06-15 | Disposition: A | Discharge status: Home / Self Care | Payer: Medicaid Other | Source: Ambulatory Visit | Attending: Obstetrics and Gynecology | Admitting: Obstetrics and Gynecology

## 2019-06-15 ENCOUNTER — Encounter (HOSPITAL_COMMUNITY): Payer: Self-pay

## 2019-06-15 VITALS — BP 129/71 | HR 94 | Temp 97.7°F

## 2019-06-15 DIAGNOSIS — O09529 Supervision of elderly multigravida, unspecified trimester: Secondary | ICD-10-CM | POA: Diagnosis present

## 2019-06-15 DIAGNOSIS — O09292 Supervision of pregnancy with other poor reproductive or obstetric history, second trimester: Secondary | ICD-10-CM

## 2019-06-15 DIAGNOSIS — O09522 Supervision of elderly multigravida, second trimester: Secondary | ICD-10-CM | POA: Diagnosis not present

## 2019-06-15 DIAGNOSIS — O365920 Maternal care for other known or suspected poor fetal growth, second trimester, not applicable or unspecified: Secondary | ICD-10-CM

## 2019-06-15 DIAGNOSIS — O365990 Maternal care for other known or suspected poor fetal growth, unspecified trimester, not applicable or unspecified: Secondary | ICD-10-CM | POA: Insufficient documentation

## 2019-06-15 DIAGNOSIS — O09212 Supervision of pregnancy with history of pre-term labor, second trimester: Secondary | ICD-10-CM

## 2019-06-15 DIAGNOSIS — O99332 Smoking (tobacco) complicating pregnancy, second trimester: Secondary | ICD-10-CM

## 2019-06-15 DIAGNOSIS — Z3A27 27 weeks gestation of pregnancy: Secondary | ICD-10-CM

## 2019-06-15 DIAGNOSIS — Z362 Encounter for other antenatal screening follow-up: Secondary | ICD-10-CM

## 2019-06-15 MED ORDER — BETAMETHASONE SOD PHOS & ACET 6 (3-3) MG/ML IJ SUSP
12.0000 mg | Freq: Once | INTRAMUSCULAR | Status: AC
  Administered 2019-06-16: 12 mg via INTRAMUSCULAR

## 2019-06-15 MED ORDER — BETAMETHASONE SOD PHOS & ACET 6 (3-3) MG/ML IJ SUSP
12.0000 mg | Freq: Once | INTRAMUSCULAR | Status: AC
  Administered 2019-06-15: 17:00:00 12 mg via INTRAMUSCULAR

## 2019-06-15 NOTE — Procedures (Signed)
Wendy Vincent 1975/11/07 [redacted]w[redacted]d  Fetus A Non-Stress Test Interpretation for 06/15/19  Indication: IUGR  Fetal Heart Rate A Mode: External Baseline Rate (A): 150 bpm Variability: Moderate Accelerations: 10 x 10 Decelerations: None Multiple birth?: No  Uterine Activity Mode: Palpation, Toco Contraction Frequency (min): none Resting Tone Palpated: Relaxed Resting Time: Adequate  Interpretation (Fetal Testing) Nonstress Test Interpretation: Reactive Overall Impression: Reassuring for gestational age Comments: Reviewed tracing with Dr. Donalee Citrin

## 2019-06-16 ENCOUNTER — Ambulatory Visit (HOSPITAL_COMMUNITY): Payer: Medicaid Other | Attending: Obstetrics and Gynecology

## 2019-06-16 ENCOUNTER — Other Ambulatory Visit: Payer: Self-pay

## 2019-06-16 ENCOUNTER — Other Ambulatory Visit (HOSPITAL_COMMUNITY): Payer: Self-pay | Admitting: *Deleted

## 2019-06-16 DIAGNOSIS — O365990 Maternal care for other known or suspected poor fetal growth, unspecified trimester, not applicable or unspecified: Secondary | ICD-10-CM | POA: Diagnosis present

## 2019-06-16 DIAGNOSIS — O365930 Maternal care for other known or suspected poor fetal growth, third trimester, not applicable or unspecified: Secondary | ICD-10-CM

## 2019-06-16 MED ORDER — BETAMETHASONE SOD PHOS & ACET 6 (3-3) MG/ML IJ SUSP: 12.0000 mg | Freq: Once | INTRAMUSCULAR | Status: DC

## 2019-06-21 ENCOUNTER — Ambulatory Visit (HOSPITAL_COMMUNITY): Payer: Medicaid Other | Admitting: *Deleted

## 2019-06-21 ENCOUNTER — Ambulatory Visit (HOSPITAL_COMMUNITY)
Admission: RE | Admit: 2019-06-21 | Discharge: 2019-06-21 | Disposition: A | Discharge status: Home / Self Care | Payer: Medicaid Other | Source: Ambulatory Visit | Attending: Obstetrics and Gynecology | Admitting: Obstetrics and Gynecology

## 2019-06-21 ENCOUNTER — Other Ambulatory Visit: Payer: Self-pay

## 2019-06-21 ENCOUNTER — Encounter (HOSPITAL_COMMUNITY): Payer: Self-pay

## 2019-06-21 VITALS — BP 118/66 | HR 90 | Temp 98.6°F

## 2019-06-21 DIAGNOSIS — O09523 Supervision of elderly multigravida, third trimester: Secondary | ICD-10-CM

## 2019-06-21 DIAGNOSIS — O09293 Supervision of pregnancy with other poor reproductive or obstetric history, third trimester: Secondary | ICD-10-CM

## 2019-06-21 DIAGNOSIS — O09213 Supervision of pregnancy with history of pre-term labor, third trimester: Secondary | ICD-10-CM

## 2019-06-21 DIAGNOSIS — O365930 Maternal care for other known or suspected poor fetal growth, third trimester, not applicable or unspecified: Secondary | ICD-10-CM | POA: Diagnosis present

## 2019-06-21 DIAGNOSIS — Z3A28 28 weeks gestation of pregnancy: Secondary | ICD-10-CM

## 2019-06-21 DIAGNOSIS — O365990 Maternal care for other known or suspected poor fetal growth, unspecified trimester, not applicable or unspecified: Secondary | ICD-10-CM | POA: Insufficient documentation

## 2019-06-21 DIAGNOSIS — O99333 Smoking (tobacco) complicating pregnancy, third trimester: Secondary | ICD-10-CM

## 2019-06-21 NOTE — Procedures (Signed)
Wendy Vincent [birth year] [redacted]w[redacted]d  Fetus A Non-Stress Test Interpretation for 06/21/19  Indication: IUGR  Fetal Heart Rate A Mode: External Baseline Rate (A): 145 bpm Variability: Moderate Accelerations: 10 x 10 Decelerations: None Multiple birth?: No  Uterine Activity Mode: Toco Contraction Frequency (min): none noted  Interpretation (Fetal Testing) Nonstress Test Interpretation: Reactive Comments: FHR tracing rev'd by Dr. Annamaria Boots.  Fetus very active, difficult to trace.

## 2019-06-26 ENCOUNTER — Encounter: Payer: Self-pay | Admitting: *Deleted

## 2019-06-28 ENCOUNTER — Ambulatory Visit (HOSPITAL_COMMUNITY): Payer: Medicaid Other | Admitting: *Deleted

## 2019-06-28 ENCOUNTER — Other Ambulatory Visit: Payer: Self-pay

## 2019-06-28 ENCOUNTER — Encounter (HOSPITAL_COMMUNITY): Payer: Self-pay

## 2019-06-28 ENCOUNTER — Ambulatory Visit (HOSPITAL_COMMUNITY)
Admission: RE | Admit: 2019-06-28 | Discharge: 2019-06-28 | Disposition: A | Discharge status: Home / Self Care | Payer: Medicaid Other | Source: Ambulatory Visit | Attending: Obstetrics and Gynecology | Admitting: Obstetrics and Gynecology

## 2019-06-28 VITALS — BP 120/62 | HR 83 | Temp 98.5°F

## 2019-06-28 DIAGNOSIS — O99333 Smoking (tobacco) complicating pregnancy, third trimester: Secondary | ICD-10-CM

## 2019-06-28 DIAGNOSIS — O365990 Maternal care for other known or suspected poor fetal growth, unspecified trimester, not applicable or unspecified: Secondary | ICD-10-CM | POA: Insufficient documentation

## 2019-06-28 DIAGNOSIS — O09529 Supervision of elderly multigravida, unspecified trimester: Secondary | ICD-10-CM | POA: Insufficient documentation

## 2019-06-28 DIAGNOSIS — O09523 Supervision of elderly multigravida, third trimester: Secondary | ICD-10-CM | POA: Diagnosis not present

## 2019-06-28 DIAGNOSIS — O365930 Maternal care for other known or suspected poor fetal growth, third trimester, not applicable or unspecified: Secondary | ICD-10-CM | POA: Diagnosis not present

## 2019-06-28 DIAGNOSIS — O09213 Supervision of pregnancy with history of pre-term labor, third trimester: Secondary | ICD-10-CM

## 2019-06-28 DIAGNOSIS — Z3A29 29 weeks gestation of pregnancy: Secondary | ICD-10-CM

## 2019-06-28 NOTE — Procedures (Signed)
Wendy Vincent 02-10-1976 [redacted]w[redacted]d  Fetus A Non-Stress Test Interpretation for 06/28/19  Indication: IUGR  Fetal Heart Rate A Mode: External Baseline Rate (A): 150 bpm Variability: Moderate Accelerations: 10 x 10 Decelerations: None Multiple birth?: No  Uterine Activity Mode: Toco Contraction Frequency (min): none noted  Interpretation (Fetal Testing) Nonstress Test Interpretation: Reactive Comments: FHR tracing rev'd by Dr. Annamaria Boots

## 2019-06-30 ENCOUNTER — Encounter: Payer: Self-pay | Admitting: Obstetrics and Gynecology

## 2019-06-30 ENCOUNTER — Other Ambulatory Visit: Payer: Self-pay

## 2019-06-30 ENCOUNTER — Ambulatory Visit (INDEPENDENT_AMBULATORY_CARE_PROVIDER_SITE_OTHER): Payer: Medicaid Other | Admitting: Obstetrics and Gynecology

## 2019-06-30 VITALS — BP 144/79 | HR 94 | Wt 205.1 lb

## 2019-06-30 DIAGNOSIS — O365930 Maternal care for other known or suspected poor fetal growth, third trimester, not applicable or unspecified: Secondary | ICD-10-CM

## 2019-06-30 DIAGNOSIS — O0993 Supervision of high risk pregnancy, unspecified, third trimester: Secondary | ICD-10-CM

## 2019-06-30 DIAGNOSIS — O09293 Supervision of pregnancy with other poor reproductive or obstetric history, third trimester: Secondary | ICD-10-CM

## 2019-06-30 DIAGNOSIS — O09299 Supervision of pregnancy with other poor reproductive or obstetric history, unspecified trimester: Secondary | ICD-10-CM

## 2019-06-30 DIAGNOSIS — O359XX0 Maternal care for (suspected) fetal abnormality and damage, unspecified, not applicable or unspecified: Secondary | ICD-10-CM

## 2019-06-30 DIAGNOSIS — O09523 Supervision of elderly multigravida, third trimester: Secondary | ICD-10-CM

## 2019-06-30 DIAGNOSIS — Z3A30 30 weeks gestation of pregnancy: Secondary | ICD-10-CM

## 2019-06-30 DIAGNOSIS — Z8632 Personal history of gestational diabetes: Secondary | ICD-10-CM

## 2019-06-30 DIAGNOSIS — O09899 Supervision of other high risk pregnancies, unspecified trimester: Secondary | ICD-10-CM

## 2019-06-30 DIAGNOSIS — O09893 Supervision of other high risk pregnancies, third trimester: Secondary | ICD-10-CM

## 2019-06-30 DIAGNOSIS — Z3009 Encounter for other general counseling and advice on contraception: Secondary | ICD-10-CM

## 2019-06-30 NOTE — Progress Notes (Signed)
Subjective:  Wendy Vincent is a 43 y.o. [id number] at [redacted]w[redacted]d being seen today for ongoing prenatal care.  She is currently monitored for the following issues for this high-risk pregnancy and has Current smoker; History of preterm delivery, currently pregnant; History of gestational diabetes in prior pregnancy, currently pregnant; AMA (advanced maternal age) multigravida 91+; Supervision of high-risk pregnancy; Known fetal anomaly, antepartum; Small for gestational age; IUGR (intrauterine growth restriction) affecting care of mother; Previous child with anomaly, antepartum; and Unwanted fertility on their problem list.  Patient reports no complaints.  Contractions: Not present. Vag. Bleeding: None.  Movement: Present. Denies leaking of fluid.   The following portions of the patient's history were reviewed and updated as appropriate: allergies, current medications, past family history, past medical history, past social history, past surgical history and problem list. Problem list updated.  Objective:   Vitals:   06/30/19 1035  BP: (!) 144/79  Pulse: 94  Weight: 205 lb 1.6 oz (93 kg)    Fetal Status: Fetal Heart Rate (bpm): 155   Movement: Present     General:  Alert, oriented and cooperative. Patient is in no acute distress.  Skin: Skin is warm and dry. No rash noted.   Cardiovascular: Normal heart rate noted  Respiratory: Normal respiratory effort, no problems with respiration noted  Abdomen: Soft, gravid, appropriate for gestational age. Pain/Pressure: Absent     Pelvic:  Cervical exam deferred        Extremities: Normal range of motion.  Edema: None  Mental Status: Normal mood and affect. Normal behavior. Normal judgment and thought content.   Urinalysis:      Assessment and Plan:  Pregnancy: [id number] at [redacted]w[redacted]d  1. Supervision of high risk pregnancy in third trimester Stable  2. Poor fetal growth affecting management of mother in third trimester, single or unspecified fetus Growth and  antenatal testing as per MFM S/P BMZ 11/19 & 06/16/19  3. Multigravida of advanced maternal age in third trimester Stable  4. History of gestational diabetes in prior pregnancy, currently pregnant NL 3 hr GTT  5. History of preterm delivery, currently pregnant IOL for ? "placenta issues" No S/Sx of PTL at present  6. Known fetal anomaly, antepartum, single or unspecified fetus Previous child with dwarfish Same FOB Declined amnio  7. Previous child with anomaly, antepartum Nl fetal ECHO  8. Unwanted fertility BTL papers signed  Preterm labor symptoms and general obstetric precautions including but not limited to vaginal bleeding, contractions, leaking of fluid and fetal movement were reviewed in detail with the patient. Please refer to After Visit Summary for other counseling recommendations.  Return in about 2 weeks (around 07/14/2019) for OB visit, face to face, MD provider.   Chancy Milroy, MD

## 2019-06-30 NOTE — Patient Instructions (Signed)
Third Trimester of Pregnancy The third trimester is from week 28 through week 40 (months 7 through 9). The third trimester is a time when the unborn baby (fetus) is growing rapidly. At the end of the ninth month, the fetus is about 20 inches in length and weighs 6-10 pounds. Body changes during your third trimester Your body will continue to go through many changes during pregnancy. The changes vary from woman to woman. During the third trimester:  Your weight will continue to increase. You can expect to gain 25-35 pounds (11-16 kg) by the end of the pregnancy.  You may begin to get stretch marks on your hips, abdomen, and breasts.  You may urinate more often because the fetus is moving lower into your pelvis and pressing on your bladder.  You may develop or continue to have heartburn. This is caused by increased hormones that slow down muscles in the digestive tract.  You may develop or continue to have constipation because increased hormones slow digestion and cause the muscles that push waste through your intestines to relax.  You may develop hemorrhoids. These are swollen veins (varicose veins) in the rectum that can itch or be painful.  You may develop swollen, bulging veins (varicose veins) in your legs.  You may have increased body aches in the pelvis, back, or thighs. This is due to weight gain and increased hormones that are relaxing your joints.  You may have changes in your hair. These can include thickening of your hair, rapid growth, and changes in texture. Some women also have hair loss during or after pregnancy, or hair that feels dry or thin. Your hair will most likely return to normal after your baby is born.  Your breasts will continue to grow and they will continue to become tender. A yellow fluid (colostrum) may leak from your breasts. This is the first milk you are producing for your baby.  Your belly button may stick out.  You may notice more swelling in your hands,  face, or ankles.  You may have increased tingling or numbness in your hands, arms, and legs. The skin on your belly may also feel numb.  You may feel short of breath because of your expanding uterus.  You may have more problems sleeping. This can be caused by the size of your belly, increased need to urinate, and an increase in your body's metabolism.  You may notice the fetus "dropping," or moving lower in your abdomen (lightening).  You may have increased vaginal discharge.  You may notice your joints feel loose and you may have pain around your pelvic bone. What to expect at prenatal visits You will have prenatal exams every 2 weeks until week 36. Then you will have weekly prenatal exams. During a routine prenatal visit:  You will be weighed to make sure you and the baby are growing normally.  Your blood pressure will be taken.  Your abdomen will be measured to track your baby's growth.  The fetal heartbeat will be listened to.  Any test results from the previous visit will be discussed.  You may have a cervical check near your due date to see if your cervix has softened or thinned (effaced).  You will be tested for Group B streptococcus. This happens between 35 and 37 weeks. Your health care provider may ask you:  What your birth plan is.  How you are feeling.  If you are feeling the baby move.  If you have had any abnormal   symptoms, such as leaking fluid, bleeding, severe headaches, or abdominal cramping.  If you are using any tobacco products, including cigarettes, chewing tobacco, and electronic cigarettes.  If you have any questions. Other tests or screenings that may be performed during your third trimester include:  Blood tests that check for low iron levels (anemia).  Fetal testing to check the health, activity level, and growth of the fetus. Testing is done if you have certain medical conditions or if there are problems during the pregnancy.  Nonstress test  (NST). This test checks the health of your baby to make sure there are no signs of problems, such as the baby not getting enough oxygen. During this test, a belt is placed around your belly. The baby is made to move, and its heart rate is monitored during movement. What is false labor? False labor is a condition in which you feel small, irregular tightenings of the muscles in the womb (contractions) that usually go away with rest, changing position, or drinking water. These are called Braxton Hicks contractions. Contractions may last for hours, days, or even weeks before true labor sets in. If contractions come at regular intervals, become more frequent, increase in intensity, or become painful, you should see your health care provider. What are the signs of labor?  Abdominal cramps.  Regular contractions that start at 10 minutes apart and become stronger and more frequent with time.  Contractions that start on the top of the uterus and spread down to the lower abdomen and back.  Increased pelvic pressure and dull back pain.  A watery or bloody mucus discharge that comes from the vagina.  Leaking of amniotic fluid. This is also known as your "water breaking." It could be a slow trickle or a gush. Let your health care provider know if it has a color or strange odor. If you have any of these signs, call your health care provider right away, even if it is before your due date. Follow these instructions at home: Medicines  Follow your health care provider's instructions regarding medicine use. Specific medicines may be either safe or unsafe to take during pregnancy.  Take a prenatal vitamin that contains at least 600 micrograms (mcg) of folic acid.  If you develop constipation, try taking a stool softener if your health care provider approves. Eating and drinking   Eat a balanced diet that includes fresh fruits and vegetables, whole grains, good sources of protein such as meat, eggs, or tofu,  and low-fat dairy. Your health care provider will help you determine the amount of weight gain that is right for you.  Avoid raw meat and uncooked cheese. These carry germs that can cause birth defects in the baby.  If you have low calcium intake from food, talk to your health care provider about whether you should take a daily calcium supplement.  Eat four or five small meals rather than three large meals a day.  Limit foods that are high in fat and processed sugars, such as fried and sweet foods.  To prevent constipation: ? Drink enough fluid to keep your urine clear or pale yellow. ? Eat foods that are high in fiber, such as fresh fruits and vegetables, whole grains, and beans. Activity  Exercise only as directed by your health care provider. Most women can continue their usual exercise routine during pregnancy. Try to exercise for 30 minutes at least 5 days a week. Stop exercising if you experience uterine contractions.  Avoid heavy lifting.  Do   not exercise in extreme heat or humidity, or at high altitudes.  Wear low-heel, comfortable shoes.  Practice good posture.  You may continue to have sex unless your health care provider tells you otherwise. Relieving pain and discomfort  Take frequent breaks and rest with your legs elevated if you have leg cramps or low back pain.  Take warm sitz baths to soothe any pain or discomfort caused by hemorrhoids. Use hemorrhoid cream if your health care provider approves.  Wear a good support bra to prevent discomfort from breast tenderness.  If you develop varicose veins: ? Wear support pantyhose or compression stockings as told by your healthcare provider. ? Elevate your feet for 15 minutes, 3-4 times a day. Prenatal care  Write down your questions. Take them to your prenatal visits.  Keep all your prenatal visits as told by your health care provider. This is important. Safety  Wear your seat belt at all times when driving.  Make  a list of emergency phone numbers, including numbers for family, friends, the hospital, and police and fire departments. General instructions  Avoid cat litter boxes and soil used by cats. These carry germs that can cause birth defects in the baby. If you have a cat, ask someone to clean the litter box for you.  Do not travel far distances unless it is absolutely necessary and only with the approval of your health care provider.  Do not use hot tubs, steam rooms, or saunas.  Do not drink alcohol.  Do not use any products that contain nicotine or tobacco, such as cigarettes and e-cigarettes. If you need help quitting, ask your health care provider.  Do not use any medicinal herbs or unprescribed drugs. These chemicals affect the formation and growth of the baby.  Do not douche or use tampons or scented sanitary pads.  Do not cross your legs for long periods of time.  To prepare for the arrival of your baby: ? Take prenatal classes to understand, practice, and ask questions about labor and delivery. ? Make a trial run to the hospital. ? Visit the hospital and tour the maternity area. ? Arrange for maternity or paternity leave through employers. ? Arrange for family and friends to take care of pets while you are in the hospital. ? Purchase a rear-facing car seat and make sure you know how to install it in your car. ? Pack your hospital bag. ? Prepare the baby's nursery. Make sure to remove all pillows and stuffed animals from the baby's crib to prevent suffocation.  Visit your dentist if you have not gone during your pregnancy. Use a soft toothbrush to brush your teeth and be gentle when you floss. Contact a health care provider if:  You are unsure if you are in labor or if your water has broken.  You become dizzy.  You have mild pelvic cramps, pelvic pressure, or nagging pain in your abdominal area.  You have lower back pain.  You have persistent nausea, vomiting, or diarrhea.   You have an unusual or bad smelling vaginal discharge.  You have pain when you urinate. Get help right away if:  Your water breaks before 37 weeks.  You have regular contractions less than 5 minutes apart before 37 weeks.  You have a fever.  You are leaking fluid from your vagina.  You have spotting or bleeding from your vagina.  You have severe abdominal pain or cramping.  You have rapid weight loss or weight gain.  You have   shortness of breath with chest pain.  You notice sudden or extreme swelling of your face, hands, ankles, feet, or legs.  Your baby makes fewer than 10 movements in 2 hours.  You have severe headaches that do not go away when you take medicine.  You have vision changes. Summary  The third trimester is from week 28 through week 40, months 7 through 9. The third trimester is a time when the unborn baby (fetus) is growing rapidly.  During the third trimester, your discomfort may increase as you and your baby continue to gain weight. You may have abdominal, leg, and back pain, sleeping problems, and an increased need to urinate.  During the third trimester your breasts will keep growing and they will continue to become tender. A yellow fluid (colostrum) may leak from your breasts. This is the first milk you are producing for your baby.  False labor is a condition in which you feel small, irregular tightenings of the muscles in the womb (contractions) that eventually go away. These are called Braxton Hicks contractions. Contractions may last for hours, days, or even weeks before true labor sets in.  Signs of labor can include: abdominal cramps; regular contractions that start at 10 minutes apart and become stronger and more frequent with time; watery or bloody mucus discharge that comes from the vagina; increased pelvic pressure and dull back pain; and leaking of amniotic fluid. This information is not intended to replace advice given to you by your health  care provider. Make sure you discuss any questions you have with your health care provider. Document Released: 07/07/2001 Document Revised: 11/03/2018 Document Reviewed: 08/18/2016 Elsevier Patient Education  2020 [employer]  

## 2019-07-05 ENCOUNTER — Ambulatory Visit (HOSPITAL_COMMUNITY)
Admission: RE | Admit: 2019-07-05 | Discharge: 2019-07-05 | Disposition: A | Discharge status: Home / Self Care | Payer: Medicaid Other | Source: Ambulatory Visit | Attending: Obstetrics and Gynecology | Admitting: Obstetrics and Gynecology

## 2019-07-05 ENCOUNTER — Encounter (HOSPITAL_COMMUNITY): Payer: Self-pay

## 2019-07-05 ENCOUNTER — Ambulatory Visit (HOSPITAL_COMMUNITY): Payer: Medicaid Other | Admitting: *Deleted

## 2019-07-05 ENCOUNTER — Other Ambulatory Visit: Payer: Self-pay

## 2019-07-05 ENCOUNTER — Ambulatory Visit (HOSPITAL_BASED_OUTPATIENT_CLINIC_OR_DEPARTMENT_OTHER): Payer: Medicaid Other | Admitting: *Deleted

## 2019-07-05 VITALS — BP 118/67 | HR 103 | Temp 98.2°F

## 2019-07-05 DIAGNOSIS — O365920 Maternal care for other known or suspected poor fetal growth, second trimester, not applicable or unspecified: Secondary | ICD-10-CM

## 2019-07-05 DIAGNOSIS — O289 Unspecified abnormal findings on antenatal screening of mother: Secondary | ICD-10-CM | POA: Diagnosis not present

## 2019-07-05 DIAGNOSIS — O365930 Maternal care for other known or suspected poor fetal growth, third trimester, not applicable or unspecified: Secondary | ICD-10-CM | POA: Insufficient documentation

## 2019-07-05 DIAGNOSIS — O365990 Maternal care for other known or suspected poor fetal growth, unspecified trimester, not applicable or unspecified: Secondary | ICD-10-CM

## 2019-07-05 DIAGNOSIS — Z3A29 29 weeks gestation of pregnancy: Secondary | ICD-10-CM | POA: Diagnosis present

## 2019-07-05 DIAGNOSIS — Z3A28 28 weeks gestation of pregnancy: Secondary | ICD-10-CM | POA: Diagnosis not present

## 2019-07-05 DIAGNOSIS — O09523 Supervision of elderly multigravida, third trimester: Secondary | ICD-10-CM | POA: Insufficient documentation

## 2019-07-05 NOTE — Procedures (Signed)
Wendy Vincent [birth year] [redacted]w[redacted]d  Fetus A Non-Stress Test Interpretation for 07/05/19  Indication: IUGR  Fetal Heart Rate A Mode: External Baseline Rate (A): 155 bpm Variability: Moderate Accelerations: 10 x 10 Decelerations: None  Uterine Activity Mode: Toco Contraction Frequency (min): none noted  Interpretation (Fetal Testing) Nonstress Test Interpretation: Reactive Comments: FHR tracing rev'd by Dr. Annamaria Boots

## 2019-07-06 ENCOUNTER — Other Ambulatory Visit (HOSPITAL_COMMUNITY): Payer: Self-pay | Admitting: Obstetrics and Gynecology

## 2019-07-06 DIAGNOSIS — O365930 Maternal care for other known or suspected poor fetal growth, third trimester, not applicable or unspecified: Secondary | ICD-10-CM

## 2019-07-12 ENCOUNTER — Ambulatory Visit (HOSPITAL_COMMUNITY)
Admission: RE | Admit: 2019-07-12 | Discharge: 2019-07-12 | Disposition: A | Discharge status: Home / Self Care | Payer: Medicaid Other | Source: Ambulatory Visit | Attending: Obstetrics and Gynecology | Admitting: Obstetrics and Gynecology

## 2019-07-12 ENCOUNTER — Ambulatory Visit (HOSPITAL_COMMUNITY): Payer: Medicaid Other | Admitting: *Deleted

## 2019-07-12 ENCOUNTER — Encounter (HOSPITAL_COMMUNITY): Payer: Self-pay

## 2019-07-12 ENCOUNTER — Other Ambulatory Visit: Payer: Self-pay

## 2019-07-12 ENCOUNTER — Ambulatory Visit (HOSPITAL_COMMUNITY): Payer: Medicaid Other

## 2019-07-12 ENCOUNTER — Ambulatory Visit (INDEPENDENT_AMBULATORY_CARE_PROVIDER_SITE_OTHER): Payer: Medicaid Other | Admitting: Obstetrics and Gynecology

## 2019-07-12 ENCOUNTER — Encounter: Payer: Self-pay | Admitting: Obstetrics and Gynecology

## 2019-07-12 VITALS — BP 133/74 | HR 105 | Wt 204.5 lb

## 2019-07-12 VITALS — BP 133/72 | HR 106 | Temp 97.2°F

## 2019-07-12 DIAGNOSIS — O09213 Supervision of pregnancy with history of pre-term labor, third trimester: Secondary | ICD-10-CM | POA: Diagnosis not present

## 2019-07-12 DIAGNOSIS — O09293 Supervision of pregnancy with other poor reproductive or obstetric history, third trimester: Secondary | ICD-10-CM | POA: Diagnosis not present

## 2019-07-12 DIAGNOSIS — Z3A31 31 weeks gestation of pregnancy: Secondary | ICD-10-CM

## 2019-07-12 DIAGNOSIS — Z362 Encounter for other antenatal screening follow-up: Secondary | ICD-10-CM | POA: Diagnosis not present

## 2019-07-12 DIAGNOSIS — O09523 Supervision of elderly multigravida, third trimester: Secondary | ICD-10-CM

## 2019-07-12 DIAGNOSIS — O99333 Smoking (tobacco) complicating pregnancy, third trimester: Secondary | ICD-10-CM

## 2019-07-12 DIAGNOSIS — O0993 Supervision of high risk pregnancy, unspecified, third trimester: Secondary | ICD-10-CM

## 2019-07-12 DIAGNOSIS — O09529 Supervision of elderly multigravida, unspecified trimester: Secondary | ICD-10-CM | POA: Insufficient documentation

## 2019-07-12 DIAGNOSIS — O365930 Maternal care for other known or suspected poor fetal growth, third trimester, not applicable or unspecified: Secondary | ICD-10-CM

## 2019-07-12 DIAGNOSIS — Z3009 Encounter for other general counseling and advice on contraception: Secondary | ICD-10-CM

## 2019-07-12 DIAGNOSIS — O09893 Supervision of other high risk pregnancies, third trimester: Secondary | ICD-10-CM

## 2019-07-12 DIAGNOSIS — O09899 Supervision of other high risk pregnancies, unspecified trimester: Secondary | ICD-10-CM

## 2019-07-12 NOTE — Procedures (Signed)
Wendy Vincent 1975/09/22 [redacted]w[redacted]d  Fetus A Non-Stress Test Interpretation for 07/12/19  Indication: IUGR  Fetal Heart Rate A Mode: External Baseline Rate (A): 135 bpm Variability: Moderate Accelerations: 10 x 10 Decelerations: None  Uterine Activity Mode: Toco Contraction Frequency (min): none noted  Interpretation (Fetal Testing) Nonstress Test Interpretation: Reactive Comments: Audible movements noted, pt reports lots of fetal movement.  FHR tracing rev'd by Dr. Donalee Citrin.

## 2019-07-12 NOTE — Progress Notes (Signed)
   PRENATAL VISIT NOTE  Subjective:  Wendy Vincent is a 43 y.o. [phone] at [redacted]w[redacted]d being seen today for ongoing prenatal care.  She is currently monitored for the following issues for this high-risk pregnancy and has Current smoker; History of preterm delivery, currently pregnant; History of gestational diabetes in prior pregnancy, currently pregnant; AMA (advanced maternal age) multigravida 71+; Supervision of high-risk pregnancy; Known fetal anomaly, antepartum; Small for gestational age; IUGR (intrauterine growth restriction) affecting care of mother; Previous child with anomaly, antepartum; and Unwanted fertility on their problem list.  Patient reports no complaints.  Contractions: Not present. Vag. Bleeding: None.  Movement: Present. Denies leaking of fluid.   The following portions of the patient's history were reviewed and updated as appropriate: allergies, current medications, past family history, past medical history, past social history, past surgical history and problem list.   Objective:   Vitals:   07/12/19 1356  BP: 133/74  Pulse: (!) 105  Weight: 204 lb 8 oz (92.8 kg)    Fetal Status: Fetal Heart Rate (bpm): 132 Fundal Height: 30 cm Movement: Present     General:  Alert, oriented and cooperative. Patient is in no acute distress.  Skin: Skin is warm and dry. No rash noted.   Cardiovascular: Normal heart rate noted  Respiratory: Normal respiratory effort, no problems with respiration noted  Abdomen: Soft, gravid, appropriate for gestational age.  Pain/Pressure: Absent     Pelvic: Cervical exam deferred        Extremities: Normal range of motion.  Edema: None  Mental Status: Normal mood and affect. Normal behavior. Normal judgment and thought content.   Assessment and Plan:  Pregnancy: [id number] at 102w5d 1. Supervision of high risk pregnancy in third trimester Patient is doing well without complaints  2. Poor fetal growth affecting management of mother in third trimester,  single or unspecified fetus Follow up MFM scan with doppler today , BPP/NST S/p BMZ 11/19-11/20  3. Multigravida of advanced maternal age in third trimester   4. Unwanted fertility BTL form signed  5. History of preterm delivery, currently pregnant Per history appears to be medically induced due to placental issues  Preterm labor symptoms and general obstetric precautions including but not limited to vaginal bleeding, contractions, leaking of fluid and fetal movement were reviewed in detail with the patient. Please refer to After Visit Summary for other counseling recommendations.   Return in about 2 weeks (around 07/26/2019) for in person, ROB, High risk.  Future Appointments  Date Time Provider [place]  07/12/2019  3:00 PM [place] [facility]-US  07/12/2019  3:00 PM [place] 3 WH-MFCUS MFC-US  07/12/2019  4:00 PM [place] NST [place] [facility]-US  07/31/2019  2:55 PM Stinson, Tanna Savoy, DO WOC-WOCA WOC    Mora Bellman, MD

## 2019-07-13 ENCOUNTER — Other Ambulatory Visit (HOSPITAL_COMMUNITY): Payer: Self-pay | Admitting: *Deleted

## 2019-07-13 DIAGNOSIS — O365930 Maternal care for other known or suspected poor fetal growth, third trimester, not applicable or unspecified: Secondary | ICD-10-CM

## 2019-07-19 ENCOUNTER — Ambulatory Visit (HOSPITAL_COMMUNITY): Payer: Medicaid Other | Admitting: *Deleted

## 2019-07-19 ENCOUNTER — Ambulatory Visit (HOSPITAL_COMMUNITY)
Admission: RE | Admit: 2019-07-19 | Discharge: 2019-07-19 | Disposition: A | Discharge status: Home / Self Care | Payer: Medicaid Other | Source: Ambulatory Visit | Attending: Obstetrics and Gynecology | Admitting: Obstetrics and Gynecology

## 2019-07-19 ENCOUNTER — Other Ambulatory Visit: Payer: Self-pay

## 2019-07-19 ENCOUNTER — Encounter (HOSPITAL_COMMUNITY): Payer: Self-pay

## 2019-07-19 VITALS — BP 113/64 | HR 98 | Temp 97.8°F

## 2019-07-19 DIAGNOSIS — O365930 Maternal care for other known or suspected poor fetal growth, third trimester, not applicable or unspecified: Secondary | ICD-10-CM

## 2019-07-19 DIAGNOSIS — O09523 Supervision of elderly multigravida, third trimester: Secondary | ICD-10-CM | POA: Diagnosis not present

## 2019-07-19 DIAGNOSIS — O09293 Supervision of pregnancy with other poor reproductive or obstetric history, third trimester: Secondary | ICD-10-CM

## 2019-07-19 DIAGNOSIS — O0990 Supervision of high risk pregnancy, unspecified, unspecified trimester: Secondary | ICD-10-CM | POA: Insufficient documentation

## 2019-07-19 DIAGNOSIS — O09213 Supervision of pregnancy with history of pre-term labor, third trimester: Secondary | ICD-10-CM

## 2019-07-19 DIAGNOSIS — O99333 Smoking (tobacco) complicating pregnancy, third trimester: Secondary | ICD-10-CM

## 2019-07-19 DIAGNOSIS — Z3A32 32 weeks gestation of pregnancy: Secondary | ICD-10-CM

## 2019-07-19 NOTE — Procedures (Signed)
Wendy Vincent Jun 22, 1976 [redacted]w[redacted]d  Fetus A Non-Stress Test Interpretation for 07/19/19  Indication: IUGR  Fetal Heart Rate A Mode: External Baseline Rate (A): 140 bpm Variability: Moderate Accelerations: 15 x 15 Decelerations: None Multiple birth?: No  Uterine Activity Mode: Toco Contraction Frequency (min): none noted  Interpretation (Fetal Testing) Nonstress Test Interpretation: Reactive Comments: FHR tracing rev'd by Dr. Annamaria Boots

## 2019-07-26 ENCOUNTER — Ambulatory Visit (HOSPITAL_COMMUNITY): Payer: Medicaid Other | Admitting: *Deleted

## 2019-07-26 ENCOUNTER — Other Ambulatory Visit: Payer: Self-pay

## 2019-07-26 ENCOUNTER — Encounter (HOSPITAL_COMMUNITY): Payer: Self-pay

## 2019-07-26 ENCOUNTER — Ambulatory Visit (HOSPITAL_COMMUNITY)
Admission: RE | Admit: 2019-07-26 | Discharge: 2019-07-26 | Disposition: A | Discharge status: Home / Self Care | Payer: Medicaid Other | Source: Ambulatory Visit | Attending: Obstetrics and Gynecology | Admitting: Obstetrics and Gynecology

## 2019-07-26 VITALS — BP 138/64 | HR 90 | Temp 97.4°F

## 2019-07-26 DIAGNOSIS — O09529 Supervision of elderly multigravida, unspecified trimester: Secondary | ICD-10-CM | POA: Diagnosis present

## 2019-07-26 DIAGNOSIS — O09213 Supervision of pregnancy with history of pre-term labor, third trimester: Secondary | ICD-10-CM | POA: Diagnosis not present

## 2019-07-26 DIAGNOSIS — O99333 Smoking (tobacco) complicating pregnancy, third trimester: Secondary | ICD-10-CM

## 2019-07-26 DIAGNOSIS — O09293 Supervision of pregnancy with other poor reproductive or obstetric history, third trimester: Secondary | ICD-10-CM | POA: Diagnosis not present

## 2019-07-26 DIAGNOSIS — O365930 Maternal care for other known or suspected poor fetal growth, third trimester, not applicable or unspecified: Secondary | ICD-10-CM | POA: Diagnosis present

## 2019-07-26 DIAGNOSIS — Z3A33 33 weeks gestation of pregnancy: Secondary | ICD-10-CM

## 2019-07-26 DIAGNOSIS — O09523 Supervision of elderly multigravida, third trimester: Secondary | ICD-10-CM | POA: Diagnosis not present

## 2019-07-26 NOTE — Procedures (Signed)
Wendy Vincent 03/05/1976 102w5d  Fetus A Non-Stress Test Interpretation for 07/26/19  Indication: IUGR  Fetal Heart Rate A Mode: External Baseline Rate (A): 140 bpm Variability: Moderate Accelerations: 10 x 10 Decelerations: None Multiple birth?: No  Uterine Activity Mode: Toco Contraction Frequency (min): none noted  Interpretation (Fetal Testing) Nonstress Test Interpretation: Non-reactive Comments: FHR tracing rev'd by Dr. Donalee Citrin. BPP 8/10

## 2019-07-31 ENCOUNTER — Ambulatory Visit (INDEPENDENT_AMBULATORY_CARE_PROVIDER_SITE_OTHER): Payer: Medicaid Other | Admitting: Family Medicine

## 2019-07-31 ENCOUNTER — Other Ambulatory Visit: Payer: Self-pay

## 2019-07-31 VITALS — BP 134/71 | HR 83 | Wt 199.3 lb

## 2019-07-31 DIAGNOSIS — O09899 Supervision of other high risk pregnancies, unspecified trimester: Secondary | ICD-10-CM

## 2019-07-31 DIAGNOSIS — O09299 Supervision of pregnancy with other poor reproductive or obstetric history, unspecified trimester: Secondary | ICD-10-CM

## 2019-07-31 DIAGNOSIS — O365930 Maternal care for other known or suspected poor fetal growth, third trimester, not applicable or unspecified: Secondary | ICD-10-CM

## 2019-07-31 DIAGNOSIS — O09893 Supervision of other high risk pregnancies, third trimester: Secondary | ICD-10-CM

## 2019-07-31 DIAGNOSIS — O09523 Supervision of elderly multigravida, third trimester: Secondary | ICD-10-CM

## 2019-07-31 DIAGNOSIS — Z3A34 34 weeks gestation of pregnancy: Secondary | ICD-10-CM

## 2019-07-31 DIAGNOSIS — O09293 Supervision of pregnancy with other poor reproductive or obstetric history, third trimester: Secondary | ICD-10-CM

## 2019-07-31 DIAGNOSIS — O0993 Supervision of high risk pregnancy, unspecified, third trimester: Secondary | ICD-10-CM

## 2019-07-31 NOTE — Progress Notes (Signed)
   PRENATAL VISIT NOTE  Subjective:  Wendy Vincent is a 44 y.o. [id number] at [redacted]w[redacted]d being seen today for ongoing prenatal care.  She is currently monitored for the following issues for this high-risk pregnancy and has Current smoker; History of preterm delivery, currently pregnant; History of gestational diabetes in prior pregnancy, currently pregnant; AMA (advanced maternal age) multigravida 35+; Supervision of high-risk pregnancy; Known fetal anomaly, antepartum; Small for gestational age; IUGR (intrauterine growth restriction) affecting care of mother, third trimester, not applicable or unspecified fetus; Previous child with anomaly, antepartum; Unwanted fertility; and [redacted] weeks gestation of pregnancy on their problem list.  Patient reports feels full frequently.  Contractions: Not present. Vag. Bleeding: None.  Movement: Present. Denies leaking of fluid.   The following portions of the patient's history were reviewed and updated as appropriate: allergies, current medications, past family history, past medical history, past social history, past surgical history and problem list.   Objective:   Vitals:   07/31/19 1459  BP: 134/71  Pulse: 83  Weight: 199 lb 4.8 oz (90.4 kg)    Fetal Status: Fetal Heart Rate (bpm): 156 Fundal Height: 30 cm Movement: Present     General:  Alert, oriented and cooperative. Patient is in no acute distress.  Skin: Skin is warm and dry. No rash noted.   Cardiovascular: Normal heart rate noted  Respiratory: Normal respiratory effort, no problems with respiration noted  Abdomen: Soft, gravid, appropriate for gestational age.  Pain/Pressure: Present     Pelvic: Cervical exam deferred        Extremities: Normal range of motion.  Edema: None  Mental Status: Normal mood and affect. Normal behavior. Normal judgment and thought content.   Assessment and Plan:  Pregnancy: [id number] at [redacted]w[redacted]d 1. Supervision of high risk pregnancy in third trimester 5# wt loss, decreased  appetite. Recommended protein shakes btwn meals. FHT normal. Does not have 28 weeks labs. 3hr GTT was early GDM screening on 02/2019. Will have pt come back for 1hr during MFM visit Wednesday afternoon. If positive, then would just have her start testing.  2. Poor fetal growth affecting management of mother in third trimester, single or unspecified fetus  ? Primordial dwarfism. MAAC.  Weekly BPP, dopplers. Growth on   3. Multigravida of advanced maternal age in third trimester   4. History of preterm delivery, currently pregnant   5. Previous child with anomaly, antepartum Declines amniocentesis  Preterm labor symptoms and general obstetric precautions including but not limited to vaginal bleeding, contractions, leaking of fluid and fetal movement were reviewed in detail with the patient. Please refer to After Visit Summary for other counseling recommendations.   Return in about 2 weeks (around 08/14/2019).  Future Appointments  Date Time Provider Department Center  08/02/2019  1:30 PM WOC-[facility]A LAB WOC-W[facility]A WOC  08/02/2019  2:15 PM WH-MFC NST WH-MFC MFC-US  08/02/2019  2:45 PM WH-MFC NURSE WH-MFC MFC-US  08/02/2019  2:45 PM WH-MFC [place] 2 WH-MFCUS MFC-US  08/09/2019  2:15 PM WH-MFC NST WH-MFC MFC-US  08/09/2019  3:15 PM WH-MFC NURSE WH-MFC MFC-US  08/09/2019  3:15 PM WH-MFC [place] 4 WH-MFCUS MFC-US    Levie Heritage, DO

## 2019-08-01 ENCOUNTER — Other Ambulatory Visit: Payer: Self-pay | Admitting: *Deleted

## 2019-08-01 DIAGNOSIS — O0993 Supervision of high risk pregnancy, unspecified, third trimester: Secondary | ICD-10-CM

## 2019-08-02 ENCOUNTER — Ambulatory Visit (HOSPITAL_COMMUNITY): Payer: Medicaid Other | Admitting: *Deleted

## 2019-08-02 ENCOUNTER — Encounter (HOSPITAL_COMMUNITY): Payer: Self-pay

## 2019-08-02 ENCOUNTER — Other Ambulatory Visit: Payer: Self-pay

## 2019-08-02 ENCOUNTER — Telehealth: Payer: Self-pay | Admitting: *Deleted

## 2019-08-02 ENCOUNTER — Ambulatory Visit (HOSPITAL_COMMUNITY)
Admission: RE | Admit: 2019-08-02 | Discharge: 2019-08-02 | Disposition: A | Discharge status: Home / Self Care | Payer: Medicaid Other | Source: Ambulatory Visit | Attending: Obstetrics and Gynecology | Admitting: Obstetrics and Gynecology

## 2019-08-02 ENCOUNTER — Other Ambulatory Visit: Payer: Medicaid Other

## 2019-08-02 VITALS — BP 125/63 | HR 95 | Temp 97.5°F

## 2019-08-02 DIAGNOSIS — O2623 Pregnancy care for patient with recurrent pregnancy loss, third trimester: Secondary | ICD-10-CM

## 2019-08-02 DIAGNOSIS — Z362 Encounter for other antenatal screening follow-up: Secondary | ICD-10-CM

## 2019-08-02 DIAGNOSIS — O365930 Maternal care for other known or suspected poor fetal growth, third trimester, not applicable or unspecified: Secondary | ICD-10-CM | POA: Diagnosis present

## 2019-08-02 DIAGNOSIS — O10013 Pre-existing essential hypertension complicating pregnancy, third trimester: Secondary | ICD-10-CM

## 2019-08-02 DIAGNOSIS — O359XX0 Maternal care for (suspected) fetal abnormality and damage, unspecified, not applicable or unspecified: Secondary | ICD-10-CM

## 2019-08-02 DIAGNOSIS — O365990 Maternal care for other known or suspected poor fetal growth, unspecified trimester, not applicable or unspecified: Secondary | ICD-10-CM | POA: Diagnosis present

## 2019-08-02 DIAGNOSIS — O09523 Supervision of elderly multigravida, third trimester: Secondary | ICD-10-CM

## 2019-08-02 DIAGNOSIS — O99213 Obesity complicating pregnancy, third trimester: Secondary | ICD-10-CM | POA: Diagnosis not present

## 2019-08-02 DIAGNOSIS — Z3A29 29 weeks gestation of pregnancy: Secondary | ICD-10-CM

## 2019-08-02 DIAGNOSIS — O0993 Supervision of high risk pregnancy, unspecified, third trimester: Secondary | ICD-10-CM

## 2019-08-02 DIAGNOSIS — O09529 Supervision of elderly multigravida, unspecified trimester: Secondary | ICD-10-CM | POA: Diagnosis present

## 2019-08-02 DIAGNOSIS — Z3A34 34 weeks gestation of pregnancy: Secondary | ICD-10-CM

## 2019-08-02 DIAGNOSIS — O3413 Maternal care for benign tumor of corpus uteri, third trimester: Secondary | ICD-10-CM

## 2019-08-02 NOTE — Consult Note (Addendum)
MFM Note  This patient was seen due to an IUGR fetus.  She denies any problems since her last exam and reports feeling vigorous fetal movements throughout the day.  On today's exam, the overall EFW continues to measure at less than the 1st percentile for her gestational age.  The fetus did grow about half a pound over the past 3 weeks.  There was normal amniotic fluid noted today.  A biophysical profile performed today was 8 out of 8.  She also had a reactive nonstress test today.  Doppler studies of the umbilical arteries performed due to fetal growth restriction continues to show an elevated S/D ratio of 3.68 (96%). There were no signs of absent or reversed end-diastolic flow.  Due to severe fetal growth restriction, I would recommend that the patient be delivered at around 36 weeks (at the end of next week).  She was advised to continue to monitor for daily fetal movements.  She understands to go to the hospital should she feel decreased fetal movements.  The fetus was in the breech presentation today.  The patient understands that she may require a cesarean delivery should the fetus remain in the breech presentation.  She will return in 1 week for another nonstress test and biophysical profile.  A total of 15 minutes was spent counseling and coordinating the care for this patient.  Greater than 50% of the time was spent in direct face-to-face contact.

## 2019-08-02 NOTE — Procedures (Signed)
Wendy Vincent 31-Dec-1975 [redacted]w[redacted]d  Fetus A Non-Stress Test Interpretation for 08/02/19  Indication: IUGR  Fetal Heart Rate A Mode: External Baseline Rate (A): 135 bpm Variability: Moderate Accelerations: 15 x 15 Decelerations: None  Uterine Activity Mode: Toco Contraction Frequency (min): none noted  Interpretation (Fetal Testing) Nonstress Test Interpretation: Reactive Comments: FHR tracing rev'd by Dr. Parke Poisson

## 2019-08-02 NOTE — Telephone Encounter (Signed)
Called pt with interpreter Eda Royal. I informed her that after her ultrasound earlier today, Dr. Parke Poisson discussed her plan of care with Dr. Debroah Loop. Due to the baby being small they agree that she should have induction of labor @ [redacted] wks gestation and this has been scheduled for Friday 08/11/19. Also she will need prenatal visit on 1/12 @ 1100. She will be given additional instructions and information regarding the induction at that time. Pt voiced understanding and agreed to plan of care.

## 2019-08-03 ENCOUNTER — Telehealth (HOSPITAL_COMMUNITY): Payer: Self-pay | Admitting: *Deleted

## 2019-08-03 LAB — CBC
Hematocrit: 35.2 % (ref 34.0–46.6)
Hemoglobin: 11.9 g/dL (ref 11.1–15.9)
MCH: 29.8 pg (ref 26.6–33.0)
MCHC: 33.8 g/dL (ref 31.5–35.7)
MCV: 88 fL (ref 79–97)
Platelets: 324 10*3/uL (ref 150–450)
RBC: 4 x10E6/uL (ref 3.77–5.28)
RDW: 12.9 % (ref 11.7–15.4)
WBC: 10 10*3/uL (ref 3.4–10.8)

## 2019-08-03 LAB — RPR: RPR Ser Ql: NONREACTIVE

## 2019-08-03 LAB — GLUCOSE TOLERANCE, 1 HOUR: Glucose, 1Hr PP: 173 mg/dL (ref 65–199)

## 2019-08-03 LAB — HIV ANTIBODY (ROUTINE TESTING W REFLEX): HIV Screen 4th Generation wRfx: NONREACTIVE

## 2019-08-03 NOTE — Telephone Encounter (Signed)
Preadmission screen  

## 2019-08-04 ENCOUNTER — Other Ambulatory Visit: Payer: Self-pay | Admitting: Family Medicine

## 2019-08-04 ENCOUNTER — Telehealth (HOSPITAL_COMMUNITY): Payer: Self-pay | Admitting: *Deleted

## 2019-08-04 ENCOUNTER — Telehealth: Payer: Self-pay

## 2019-08-04 MED ORDER — ACCU-CHEK SOFTCLIX LANCETS MISC: 12 refills | Status: DC

## 2019-08-04 MED ORDER — ACCU-CHEK NANO SMARTVIEW W/DEVICE KIT: 1.0000 | PACK | 0 refills | Status: DC

## 2019-08-04 MED ORDER — GLUCOSE BLOOD VI STRP: ORAL_STRIP | 12 refills | Status: DC

## 2019-08-04 MED FILL — ACCU-CHEK SOFTCLIX LANCETS: 25 days supply | Qty: 100 | Fill #0

## 2019-08-04 NOTE — Telephone Encounter (Addendum)
-----   Message from Levie Heritage, DO sent at 08/04/2019 12:17 PM EST ----- Has elevated 1hr GTT. Because of how late in the pregnancy it is, would get have her check her CBGs. Supplies sent to pharmacy.  Notified pt of results and provider recommendation.  I also informed pt how she needs to check her BS, to write them down, and bring paper to her appt.  Pt verbalized understanding.    Addison Naegeli, RN 08/04/19

## 2019-08-04 NOTE — Telephone Encounter (Signed)
Preadmission screen  

## 2019-08-04 NOTE — Progress Notes (Signed)
Has elevated 1hr GTT. Because of how late in the pregnancy it is, would get have her check her CBGs. Supplies sent to pharmacy.

## 2019-08-07 ENCOUNTER — Other Ambulatory Visit: Payer: Self-pay | Admitting: Family Medicine

## 2019-08-07 NOTE — Progress Notes (Signed)
Induction orders placed

## 2019-08-08 ENCOUNTER — Other Ambulatory Visit: Payer: Self-pay | Admitting: Family Medicine

## 2019-08-08 ENCOUNTER — Other Ambulatory Visit: Payer: Self-pay

## 2019-08-08 ENCOUNTER — Other Ambulatory Visit (HOSPITAL_COMMUNITY)
Admission: RE | Admit: 2019-08-08 | Discharge: 2019-08-08 | Disposition: A | Discharge status: Home / Self Care | Payer: Medicaid Other | Source: Ambulatory Visit | Attending: Obstetrics and Gynecology | Admitting: Obstetrics and Gynecology

## 2019-08-08 ENCOUNTER — Ambulatory Visit (INDEPENDENT_AMBULATORY_CARE_PROVIDER_SITE_OTHER): Payer: Medicaid Other | Admitting: Obstetrics and Gynecology

## 2019-08-08 VITALS — BP 119/77 | HR 106 | Temp 98.2°F | Wt 201.2 lb

## 2019-08-08 DIAGNOSIS — O24410 Gestational diabetes mellitus in pregnancy, diet controlled: Secondary | ICD-10-CM

## 2019-08-08 DIAGNOSIS — O365930 Maternal care for other known or suspected poor fetal growth, third trimester, not applicable or unspecified: Secondary | ICD-10-CM

## 2019-08-08 DIAGNOSIS — O24419 Gestational diabetes mellitus in pregnancy, unspecified control: Secondary | ICD-10-CM | POA: Insufficient documentation

## 2019-08-08 DIAGNOSIS — Z3009 Encounter for other general counseling and advice on contraception: Secondary | ICD-10-CM

## 2019-08-08 DIAGNOSIS — O0993 Supervision of high risk pregnancy, unspecified, third trimester: Secondary | ICD-10-CM | POA: Diagnosis present

## 2019-08-08 DIAGNOSIS — Z3A35 35 weeks gestation of pregnancy: Secondary | ICD-10-CM

## 2019-08-08 MED ORDER — ACCU-CHEK GUIDE VI STRP: ORAL_STRIP | 12 refills | Status: DC

## 2019-08-08 MED FILL — ACCU-CHEK GUIDE TEST STRIP: 25 days supply | Qty: 100 | Fill #0

## 2019-08-08 NOTE — Patient Instructions (Signed)

## 2019-08-08 NOTE — Progress Notes (Signed)
[facility] pharmacy called office to request new Rx be sent for glucose test strips. New Rx sent to pharmacy that will be covered by patient's insurance and pharmacy notified; prior test strip order discontinued.

## 2019-08-08 NOTE — Progress Notes (Signed)
Subjective:  Wendy Vincent is a 44 y.o. D3U2025 at [redacted]w[redacted]d being seen today for ongoing prenatal care.  She is currently monitored for the following issues for this high-risk pregnancy and has Current smoker; History of preterm delivery, currently pregnant; History of gestational diabetes in prior pregnancy, currently pregnant; AMA (advanced maternal age) multigravida 35+; Supervision of high-risk pregnancy; Known fetal anomaly, antepartum; Small for gestational age; IUGR (intrauterine growth restriction) affecting care of mother, third trimester, not applicable or unspecified fetus; Previous child with anomaly, antepartum; Unwanted fertility; [redacted] weeks gestation of pregnancy; and Gestational diabetes on their problem list.  Patient reports general discomforts of pregnancy.  Contractions: Not present. Vag. Bleeding: None.  Movement: Present. Denies leaking of fluid.   The following portions of the patient's history were reviewed and updated as appropriate: allergies, current medications, past family history, past medical history, past social history, past surgical history and problem list. Problem list updated.  Objective:   Vitals:   08/08/19 1058  BP: 119/77  Pulse: (!) 106  Temp: 98.2 F (36.8 C)  Weight: 201 lb 3.2 oz (91.3 kg)    Fetal Status:     Movement: Present     General:  Alert, oriented and cooperative. Patient is in no acute distress.  Skin: Skin is warm and dry. No rash noted.   Cardiovascular: Normal heart rate noted  Respiratory: Normal respiratory effort, no problems with respiration noted  Abdomen: Soft, gravid, appropriate for gestational age. Pain/Pressure: Present     Pelvic:  Cervical exam performed        Extremities: Normal range of motion.  Edema: None  Mental Status: Normal mood and affect. Normal behavior. Normal judgment and thought content.   Urinalysis:      Assessment and Plan:  Pregnancy: K2H0623 at [redacted]w[redacted]d  1. Supervision of high risk pregnancy in third  trimester IOL on Friday - GC/Chlamydia probe amp ([place])not at [facility] - Strep Gp B NAA  2. IUGR (intrauterine growth restriction) affecting care of mother, third trimester, not applicable or unspecified fetus U/S tomorrow IOL on Friday  3. Diet controlled gestational diabetes mellitus (GDM) in third trimester Following diet but has not be able to pick up supplies Instructed to continue to follow diet but will not check CBG's as IOL this Friday  4. Unwanted fertility BTL papers signed   Preterm labor symptoms and general obstetric precautions including but not limited to vaginal bleeding, contractions, leaking of fluid and fetal movement were reviewed in detail with the patient. Please refer to After Visit Summary for other counseling recommendations.  Return for PP visit  4 weeks from 08/11/19.   Hermina Staggers, MD

## 2019-08-08 NOTE — Progress Notes (Signed)
Pt has not started checking CBG's - has not obtained supplies from pharmacy.

## 2019-08-09 ENCOUNTER — Ambulatory Visit (HOSPITAL_COMMUNITY): Payer: Medicaid Other | Admitting: *Deleted

## 2019-08-09 ENCOUNTER — Ambulatory Visit (HOSPITAL_COMMUNITY)
Admission: RE | Admit: 2019-08-09 | Discharge: 2019-08-09 | Disposition: A | Discharge status: Home / Self Care | Payer: Medicaid Other | Source: Ambulatory Visit | Attending: Obstetrics and Gynecology | Admitting: Obstetrics and Gynecology

## 2019-08-09 ENCOUNTER — Encounter (HOSPITAL_COMMUNITY): Payer: Self-pay

## 2019-08-09 ENCOUNTER — Other Ambulatory Visit: Payer: Self-pay | Admitting: Advanced Practice Midwife

## 2019-08-09 ENCOUNTER — Other Ambulatory Visit (HOSPITAL_COMMUNITY)
Admission: RE | Admit: 2019-08-09 | Discharge: 2019-08-09 | Disposition: A | Discharge status: Home / Self Care | Payer: Medicaid Other | Source: Ambulatory Visit | Attending: Family Medicine | Admitting: Family Medicine

## 2019-08-09 VITALS — BP 119/60 | HR 88 | Temp 98.3°F

## 2019-08-09 DIAGNOSIS — Z20822 Contact with and (suspected) exposure to covid-19: Secondary | ICD-10-CM | POA: Insufficient documentation

## 2019-08-09 DIAGNOSIS — Z3A35 35 weeks gestation of pregnancy: Secondary | ICD-10-CM | POA: Diagnosis not present

## 2019-08-09 DIAGNOSIS — O09523 Supervision of elderly multigravida, third trimester: Secondary | ICD-10-CM

## 2019-08-09 DIAGNOSIS — O09213 Supervision of pregnancy with history of pre-term labor, third trimester: Secondary | ICD-10-CM

## 2019-08-09 DIAGNOSIS — O365990 Maternal care for other known or suspected poor fetal growth, unspecified trimester, not applicable or unspecified: Secondary | ICD-10-CM | POA: Diagnosis present

## 2019-08-09 DIAGNOSIS — O365930 Maternal care for other known or suspected poor fetal growth, third trimester, not applicable or unspecified: Secondary | ICD-10-CM

## 2019-08-09 DIAGNOSIS — O09293 Supervision of pregnancy with other poor reproductive or obstetric history, third trimester: Secondary | ICD-10-CM

## 2019-08-09 DIAGNOSIS — Z01818 Encounter for other preprocedural examination: Secondary | ICD-10-CM | POA: Insufficient documentation

## 2019-08-09 LAB — GC/CHLAMYDIA PROBE AMP (~~LOC~~) NOT AT ARMC
Chlamydia: NEGATIVE
Comment: NEGATIVE
Comment: NORMAL
Neisseria Gonorrhea: NEGATIVE

## 2019-08-09 LAB — SARS CORONAVIRUS 2 (TAT 6-24 HRS): SARS Coronavirus 2: NEGATIVE

## 2019-08-09 NOTE — Procedures (Signed)
[place]by[place]ail [place] 1975/12/09 [redacted]w[redacted]d  Fetus A Non-Stress Test Interpretation for 08/09/19  Indication: IUGR  Fetal Heart Rate A Mode: External Baseline Rate (A): 135 bpm Variability: Moderate Accelerations: 15 x 15 Decelerations: None Multiple birth?: No  Uterine Activity Mode: Palpation, Toco Contraction Frequency (min): none Resting Tone Palpated: Relaxed Resting Time: Adequate  Interpretation (Fetal Testing) Nonstress Test Interpretation: Reactive Comments: Reviewed tracing with Dr. Judeth Cornfield

## 2019-08-10 LAB — STREP GP B NAA: Strep Gp B NAA: NEGATIVE

## 2019-08-11 ENCOUNTER — Inpatient Hospital Stay (HOSPITAL_COMMUNITY): Payer: Medicaid Other

## 2019-08-11 ENCOUNTER — Encounter (HOSPITAL_COMMUNITY)
Admission: AD | Disposition: A | Discharge status: Home / Self Care | Payer: Self-pay | Source: Home / Self Care | Attending: Obstetrics and Gynecology

## 2019-08-11 ENCOUNTER — Other Ambulatory Visit: Payer: Self-pay

## 2019-08-11 ENCOUNTER — Inpatient Hospital Stay (HOSPITAL_COMMUNITY): Payer: Medicaid Other | Admitting: Anesthesiology

## 2019-08-11 ENCOUNTER — Encounter (HOSPITAL_COMMUNITY): Payer: Self-pay | Admitting: Obstetrics and Gynecology

## 2019-08-11 ENCOUNTER — Inpatient Hospital Stay (HOSPITAL_COMMUNITY)
Admission: AD | Admit: 2019-08-11 | Discharge: 2019-08-14 | DRG: 785 | Disposition: A | Discharge status: Home / Self Care | Payer: Medicaid Other | Attending: Obstetrics and Gynecology | Admitting: Obstetrics and Gynecology

## 2019-08-11 DIAGNOSIS — O321XX0 Maternal care for breech presentation, not applicable or unspecified: Secondary | ICD-10-CM | POA: Diagnosis present

## 2019-08-11 DIAGNOSIS — O365930 Maternal care for other known or suspected poor fetal growth, third trimester, not applicable or unspecified: Secondary | ICD-10-CM | POA: Diagnosis present

## 2019-08-11 DIAGNOSIS — Z20822 Contact with and (suspected) exposure to covid-19: Secondary | ICD-10-CM | POA: Diagnosis present

## 2019-08-11 DIAGNOSIS — Z302 Encounter for sterilization: Secondary | ICD-10-CM

## 2019-08-11 DIAGNOSIS — Z23 Encounter for immunization: Secondary | ICD-10-CM

## 2019-08-11 DIAGNOSIS — Z3009 Encounter for other general counseling and advice on contraception: Secondary | ICD-10-CM | POA: Diagnosis present

## 2019-08-11 DIAGNOSIS — O24420 Gestational diabetes mellitus in childbirth, diet controlled: Secondary | ICD-10-CM | POA: Diagnosis present

## 2019-08-11 DIAGNOSIS — O09299 Supervision of pregnancy with other poor reproductive or obstetric history, unspecified trimester: Secondary | ICD-10-CM

## 2019-08-11 DIAGNOSIS — O09529 Supervision of elderly multigravida, unspecified trimester: Secondary | ICD-10-CM

## 2019-08-11 DIAGNOSIS — O99334 Smoking (tobacco) complicating childbirth: Secondary | ICD-10-CM | POA: Diagnosis present

## 2019-08-11 DIAGNOSIS — F17210 Nicotine dependence, cigarettes, uncomplicated: Secondary | ICD-10-CM | POA: Diagnosis present

## 2019-08-11 DIAGNOSIS — O09899 Supervision of other high risk pregnancies, unspecified trimester: Secondary | ICD-10-CM

## 2019-08-11 DIAGNOSIS — Z3A36 36 weeks gestation of pregnancy: Secondary | ICD-10-CM

## 2019-08-11 DIAGNOSIS — O24419 Gestational diabetes mellitus in pregnancy, unspecified control: Secondary | ICD-10-CM | POA: Diagnosis present

## 2019-08-11 LAB — TYPE AND SCREEN
ABO/RH(D): O POS
Antibody Screen: NEGATIVE

## 2019-08-11 LAB — CBC
HCT: 38.2 % (ref 36.0–46.0)
Hemoglobin: 12.4 g/dL (ref 12.0–15.0)
MCH: 29.5 pg (ref 26.0–34.0)
MCHC: 32.5 g/dL (ref 30.0–36.0)
MCV: 90.7 fL (ref 80.0–100.0)
Platelets: 338 10*3/uL (ref 150–400)
RBC: 4.21 MIL/uL (ref 3.87–5.11)
RDW: 13.7 % (ref 11.5–15.5)
WBC: 9.3 10*3/uL (ref 4.0–10.5)
nRBC: 0 % (ref 0.0–0.2)

## 2019-08-11 LAB — ABO/RH: ABO/RH(D): O POS

## 2019-08-11 LAB — GLUCOSE, CAPILLARY
Glucose-Capillary: 95 mg/dL (ref 70–99)
Glucose-Capillary: 84 mg/dL (ref 70–99)

## 2019-08-11 LAB — RPR: RPR Ser Ql: NONREACTIVE

## 2019-08-11 LAB — CREATININE, SERUM
Creatinine, Ser: 0.36 mg/dL — ABNORMAL LOW (ref 0.44–1.00)
GFR calc Af Amer: >60 mL/min (ref >60)
GFR calc non Af Amer: >60 mL/min (ref >60)

## 2019-08-11 SURGERY — Surgical Case
Anesthesia: Spinal

## 2019-08-11 MED ORDER — SENNOSIDES-DOCUSATE SODIUM 8.6-50 MG PO TABS
2.0000 | ORAL_TABLET | ORAL | Status: DC
  Administered 2019-08-12 – 2019-08-13 (×3): 2 via ORAL
  Filled 2019-08-11 (×4): qty 2

## 2019-08-11 MED ORDER — KETOROLAC TROMETHAMINE 30 MG/ML IJ SOLN: 30.0000 mg | Freq: Once | INTRAMUSCULAR | Status: AC

## 2019-08-11 MED ORDER — DIPHENHYDRAMINE HCL 25 MG PO CAPS: 25.0000 mg | ORAL_CAPSULE | Freq: Four times a day (QID) | ORAL | Status: DC | PRN

## 2019-08-11 MED ORDER — NALOXONE HCL 0.4 MG/ML IJ SOLN: 0.4000 mg | INTRAMUSCULAR | Status: DC | PRN

## 2019-08-11 MED ORDER — SODIUM CHLORIDE 0.9 % IR SOLN
Status: DC | PRN
  Administered 2019-08-11: 1000 mL

## 2019-08-11 MED ORDER — BUPIVACAINE IN DEXTROSE 0.75-8.25 % IT SOLN
INTRATHECAL | Status: DC | PRN
  Administered 2019-08-11: 1.6 mL via INTRATHECAL

## 2019-08-11 MED ORDER — ACETAMINOPHEN 325 MG PO TABS
650.0000 mg | ORAL_TABLET | Freq: Four times a day (QID) | ORAL | Status: DC | PRN
  Administered 2019-08-11: 650 mg via ORAL
  Filled 2019-08-11: qty 2

## 2019-08-11 MED ORDER — OXYCODONE HCL 5 MG/5ML PO SOLN
ORAL | Status: AC
  Filled 2019-08-11: qty 5

## 2019-08-11 MED ORDER — KETOROLAC TROMETHAMINE 30 MG/ML IJ SOLN
INTRAMUSCULAR | Status: DC | PRN
  Administered 2019-08-11: 30 mg via INTRAVENOUS

## 2019-08-11 MED ORDER — TETANUS-DIPHTH-ACELL PERTUSSIS 5-2.5-18.5 LF-MCG/0.5 IM SUSP: 0.5000 mL | Freq: Once | INTRAMUSCULAR | Status: DC

## 2019-08-11 MED ORDER — DIBUCAINE (PERIANAL) 1 % EX OINT: 1.0000 "application " | TOPICAL_OINTMENT | CUTANEOUS | Status: DC | PRN

## 2019-08-11 MED ORDER — NALBUPHINE HCL 10 MG/ML IJ SOLN: 5.0000 mg | Freq: Once | INTRAMUSCULAR | Status: DC | PRN

## 2019-08-11 MED ORDER — PHENYLEPHRINE HCL-NACL 20-0.9 MG/250ML-% IV SOLN
INTRAVENOUS | Status: AC
  Filled 2019-08-11: qty 250

## 2019-08-11 MED ORDER — FENTANYL CITRATE (PF) 100 MCG/2ML IJ SOLN
INTRAMUSCULAR | Status: DC | PRN
  Administered 2019-08-11: 15 ug via INTRATHECAL

## 2019-08-11 MED ORDER — DIPHENHYDRAMINE HCL 25 MG PO CAPS: 25.0000 mg | ORAL_CAPSULE | ORAL | Status: DC | PRN

## 2019-08-11 MED ORDER — OXYCODONE HCL 5 MG/5ML PO SOLN
5.0000 mg | Freq: Once | ORAL | Status: DC | PRN
  Administered 2019-08-11: 5 mg via ORAL

## 2019-08-11 MED ORDER — DEXAMETHASONE SODIUM PHOSPHATE 4 MG/ML IJ SOLN
INTRAMUSCULAR | Status: AC
  Filled 2019-08-11: qty 1

## 2019-08-11 MED ORDER — OXYTOCIN 40 UNITS IN NORMAL SALINE INFUSION - SIMPLE MED
INTRAVENOUS | Status: DC | PRN
  Administered 2019-08-11: 40 mL via INTRAVENOUS

## 2019-08-11 MED ORDER — NALBUPHINE HCL 10 MG/ML IJ SOLN: 5.0000 mg | INTRAMUSCULAR | Status: DC | PRN

## 2019-08-11 MED ORDER — MORPHINE SULFATE (PF) 0.5 MG/ML IJ SOLN
INTRAMUSCULAR | Status: DC | PRN
  Administered 2019-08-11: 150 ug via INTRATHECAL

## 2019-08-11 MED ORDER — SOD CITRATE-CITRIC ACID 500-334 MG/5ML PO SOLN
30.0000 mL | ORAL | Status: DC | PRN
  Administered 2019-08-11: 30 mL via ORAL

## 2019-08-11 MED ORDER — COCONUT OIL OIL
1.0000 "application " | TOPICAL_OIL | Status: DC | PRN
  Administered 2019-08-12: 1 via TOPICAL

## 2019-08-11 MED ORDER — FENTANYL CITRATE (PF) 100 MCG/2ML IJ SOLN: 25.0000 ug | INTRAMUSCULAR | Status: DC | PRN

## 2019-08-11 MED ORDER — DIPHENHYDRAMINE HCL 50 MG/ML IJ SOLN: 12.5000 mg | INTRAMUSCULAR | Status: DC | PRN

## 2019-08-11 MED ORDER — OXYTOCIN 40 UNITS IN NORMAL SALINE INFUSION - SIMPLE MED: 2.5000 [IU]/h | INTRAVENOUS | Status: AC

## 2019-08-11 MED ORDER — NALOXONE HCL 4 MG/10ML IJ SOLN
1.0000 ug/kg/h | INTRAVENOUS | Status: DC | PRN
  Filled 2019-08-11: qty 5

## 2019-08-11 MED ORDER — ONDANSETRON HCL 4 MG/2ML IJ SOLN
INTRAMUSCULAR | Status: DC | PRN
  Administered 2019-08-11: 4 mg via INTRAVENOUS

## 2019-08-11 MED ORDER — SIMETHICONE 80 MG PO CHEW
80.0000 mg | CHEWABLE_TABLET | Freq: Three times a day (TID) | ORAL | Status: DC
  Administered 2019-08-11 – 2019-08-13 (×5): 80 mg via ORAL
  Filled 2019-08-11 (×5): qty 1

## 2019-08-11 MED ORDER — CEFAZOLIN SODIUM-DEXTROSE 2-4 GM/100ML-% IV SOLN
2.0000 g | INTRAVENOUS | Status: AC
  Administered 2019-08-11: 2 g via INTRAVENOUS

## 2019-08-11 MED ORDER — SODIUM CHLORIDE 0.9% FLUSH: 3.0000 mL | INTRAVENOUS | Status: DC | PRN

## 2019-08-11 MED ORDER — ONDANSETRON HCL 4 MG/2ML IJ SOLN: 4.0000 mg | Freq: Once | INTRAMUSCULAR | Status: DC | PRN

## 2019-08-11 MED ORDER — ENOXAPARIN SODIUM 40 MG/0.4ML ~~LOC~~ SOLN
40.0000 mg | SUBCUTANEOUS | Status: DC
  Administered 2019-08-11 – 2019-08-13 (×3): 40 mg via SUBCUTANEOUS
  Filled 2019-08-11 (×4): qty 0.4

## 2019-08-11 MED ORDER — LACTATED RINGERS IV SOLN: INTRAVENOUS | Status: DC

## 2019-08-11 MED ORDER — SODIUM CHLORIDE 0.9 % IV SOLN: INTRAVENOUS | Status: DC | PRN

## 2019-08-11 MED ORDER — LACTATED RINGERS IV SOLN: INTRAVENOUS | Status: DC | PRN

## 2019-08-11 MED ORDER — CEFAZOLIN SODIUM-DEXTROSE 2-4 GM/100ML-% IV SOLN
INTRAVENOUS | Status: AC
  Filled 2019-08-11: qty 100

## 2019-08-11 MED ORDER — OXYCODONE HCL 5 MG PO TABS: 5.0000 mg | ORAL_TABLET | Freq: Once | ORAL | Status: DC | PRN

## 2019-08-11 MED ORDER — SOD CITRATE-CITRIC ACID 500-334 MG/5ML PO SOLN
ORAL | Status: AC
  Filled 2019-08-11: qty 30

## 2019-08-11 MED ORDER — OXYCODONE HCL 5 MG PO TABS
5.0000 mg | ORAL_TABLET | ORAL | Status: DC | PRN
  Administered 2019-08-12 (×2): 10 mg via ORAL
  Administered 2019-08-12: 5 mg via ORAL
  Administered 2019-08-13 – 2019-08-14 (×4): 10 mg via ORAL
  Filled 2019-08-11 (×4): qty 2
  Filled 2019-08-11: qty 1
  Filled 2019-08-11 (×2): qty 2

## 2019-08-11 MED ORDER — PRENATAL MULTIVITAMIN CH
1.0000 | ORAL_TABLET | Freq: Every day | ORAL | Status: DC
  Administered 2019-08-12 – 2019-08-13 (×2): 1 via ORAL
  Filled 2019-08-11 (×2): qty 1

## 2019-08-11 MED ORDER — FENTANYL CITRATE (PF) 100 MCG/2ML IJ SOLN
INTRAMUSCULAR | Status: AC
  Filled 2019-08-11: qty 2

## 2019-08-11 MED ORDER — KETOROLAC TROMETHAMINE 30 MG/ML IJ SOLN: 30.0000 mg | Freq: Once | INTRAMUSCULAR | Status: DC | PRN

## 2019-08-11 MED ORDER — ZOLPIDEM TARTRATE 5 MG PO TABS: 5.0000 mg | ORAL_TABLET | Freq: Every evening | ORAL | Status: DC | PRN

## 2019-08-11 MED ORDER — MENTHOL 3 MG MT LOZG: 1.0000 | LOZENGE | OROMUCOSAL | Status: DC | PRN

## 2019-08-11 MED ORDER — SIMETHICONE 80 MG PO CHEW: 80.0000 mg | CHEWABLE_TABLET | ORAL | Status: DC | PRN

## 2019-08-11 MED ORDER — OXYTOCIN 40 UNITS IN NORMAL SALINE INFUSION - SIMPLE MED
INTRAVENOUS | Status: AC
  Filled 2019-08-11: qty 1000

## 2019-08-11 MED ORDER — MORPHINE SULFATE (PF) 0.5 MG/ML IJ SOLN
INTRAMUSCULAR | Status: AC
  Filled 2019-08-11: qty 10

## 2019-08-11 MED ORDER — PHENYLEPHRINE HCL-NACL 20-0.9 MG/250ML-% IV SOLN
INTRAVENOUS | Status: DC | PRN
  Administered 2019-08-11: 60 ug/min via INTRAVENOUS

## 2019-08-11 MED ORDER — ONDANSETRON HCL 4 MG/2ML IJ SOLN
INTRAMUSCULAR | Status: AC
  Filled 2019-08-11: qty 2

## 2019-08-11 MED ORDER — SIMETHICONE 80 MG PO CHEW
80.0000 mg | CHEWABLE_TABLET | ORAL | Status: DC
  Administered 2019-08-12 – 2019-08-13 (×3): 80 mg via ORAL
  Filled 2019-08-11 (×3): qty 1

## 2019-08-11 MED ORDER — IBUPROFEN 800 MG PO TABS
800.0000 mg | ORAL_TABLET | Freq: Three times a day (TID) | ORAL | Status: AC
  Administered 2019-08-11 – 2019-08-14 (×8): 800 mg via ORAL
  Filled 2019-08-11 (×8): qty 1

## 2019-08-11 MED ORDER — WITCH HAZEL-GLYCERIN EX PADS: 1.0000 "application " | MEDICATED_PAD | CUTANEOUS | Status: DC | PRN

## 2019-08-11 MED ORDER — ONDANSETRON HCL 4 MG/2ML IJ SOLN
4.0000 mg | Freq: Three times a day (TID) | INTRAMUSCULAR | Status: DC | PRN
  Administered 2019-08-11: 4 mg via INTRAVENOUS
  Filled 2019-08-11: qty 2

## 2019-08-11 MED ORDER — KETOROLAC TROMETHAMINE 30 MG/ML IJ SOLN
INTRAMUSCULAR | Status: AC
  Administered 2019-08-11: 14:00:00 30 mg via INTRAVENOUS
  Filled 2019-08-11: qty 1

## 2019-08-11 SURGICAL SUPPLY — 40 items
APL SKNCLS STERI-STRIP NONHPOA (GAUZE/BANDAGES/DRESSINGS) ×1
BENZOIN TINCTURE PRP APPL 2/3 (GAUZE/BANDAGES/DRESSINGS) ×3 IMPLANT
CLAMP CORD UMBIL (MISCELLANEOUS) ×2 IMPLANT
CLIP FILSHIE TUBAL LIGA STRL (Clip) ×2 IMPLANT
CLOSURE WOUND 1/2 X4 (GAUZE/BANDAGES/DRESSINGS) ×1
CLOTH BEACON ORANGE TIMEOUT ST (SAFETY) ×3 IMPLANT
DRAPE C SECTION CLR SCREEN (DRAPES) IMPLANT
DRSG OPSITE POSTOP 4X10 (GAUZE/BANDAGES/DRESSINGS) ×3 IMPLANT
ELECT REM PT RETURN 9FT ADLT (ELECTROSURGICAL) ×3
ELECTRODE REM PT RTRN 9FT ADLT (ELECTROSURGICAL) ×1 IMPLANT
EXTRACTOR VACUUM M CUP 4 TUBE (SUCTIONS) IMPLANT
EXTRACTOR VACUUM M CUP 4' TUBE (SUCTIONS)
GLOVE BIO SURGEON STRL SZ7.5 (GLOVE) ×3 IMPLANT
GLOVE BIOGEL PI IND STRL 7.0 (GLOVE) ×2 IMPLANT
GLOVE BIOGEL PI INDICATOR 7.0 (GLOVE) ×4
GOWN STRL REUS W/TWL 2XL LVL3 (GOWN DISPOSABLE) ×3 IMPLANT
GOWN STRL REUS W/TWL LRG LVL3 (GOWN DISPOSABLE) ×6 IMPLANT
KIT ABG SYR 3ML LUER SLIP (SYRINGE) IMPLANT
NDL HYPO 25X5/8 SAFETYGLIDE (NEEDLE) IMPLANT
NEEDLE HYPO 22GX1.5 SAFETY (NEEDLE) ×3 IMPLANT
NEEDLE HYPO 25X5/8 SAFETYGLIDE (NEEDLE) IMPLANT
NS IRRIG 1000ML POUR BTL (IV SOLUTION) ×3 IMPLANT
PACK C SECTION WH (CUSTOM PROCEDURE TRAY) ×3 IMPLANT
PAD OB MATERNITY 4.3X12.25 (PERSONAL CARE ITEMS) ×3 IMPLANT
PENCIL SMOKE EVAC W/HOLSTER (ELECTROSURGICAL) ×3 IMPLANT
RTRCTR C-SECT PINK 25CM LRG (MISCELLANEOUS) ×3 IMPLANT
STRIP CLOSURE SKIN 1/2X4 (GAUZE/BANDAGES/DRESSINGS) ×2 IMPLANT
SUT CHROMIC 1 CTX 36 (SUTURE) ×6 IMPLANT
SUT PLAIN 0 NONE (SUTURE) ×3 IMPLANT
SUT VIC AB 1 CT1 36 (SUTURE) ×6 IMPLANT
SUT VIC AB 2-0 CT1 (SUTURE) ×3 IMPLANT
SUT VIC AB 3-0 CT1 27 (SUTURE) ×6
SUT VIC AB 3-0 CT1 TAPERPNT 27 (SUTURE) ×2 IMPLANT
SUT VIC AB 3-0 SH 27 (SUTURE)
SUT VIC AB 3-0 SH 27X BRD (SUTURE) IMPLANT
SUT VIC AB 4-0 KS 27 (SUTURE) ×3 IMPLANT
SYR BULB IRRIGATION 50ML (SYRINGE) IMPLANT
TOWEL OR 17X24 6PK STRL BLUE (TOWEL DISPOSABLE) ×3 IMPLANT
TRAY FOLEY W/BAG SLVR 14FR LF (SET/KITS/TRAYS/PACK) ×3 IMPLANT
WATER STERILE IRR 1000ML POUR (IV SOLUTION) ×3 IMPLANT

## 2019-08-11 NOTE — Anesthesia Preprocedure Evaluation (Addendum)
Anesthesia Evaluation  Patient identified by MRN, date of birth, ID band Patient awake    Reviewed: Allergy & Precautions, NPO status , Patient's Chart, lab work & pertinent test results  History of Anesthesia Complications Negative for: history of anesthetic complications  Airway Mallampati: II  TM Distance: >3 FB Neck ROM: Full    Dental no notable dental hx.    Pulmonary Current Smoker,    Pulmonary exam normal        Cardiovascular negative cardio ROS Normal cardiovascular exam     Neuro/Psych negative neurological ROS  negative psych ROS   GI/Hepatic negative GI ROS, Neg liver ROS,   Endo/Other  diabetes, Gestational  Renal/GU negative Renal ROS  negative genitourinary   Musculoskeletal negative musculoskeletal ROS (+)   Abdominal   Peds  Hematology negative hematology ROS (+)   Anesthesia Other Findings Breech presentation  Reproductive/Obstetrics (+) Pregnancy (IUGR, breech presentation)                          Anesthesia Physical Anesthesia Plan  ASA: II  Anesthesia Plan: Spinal   Post-op Pain Management:    Induction:   PONV Risk Score and Plan: 2 and Ondansetron and Treatment may vary due to age or medical condition  Airway Management Planned: Natural Airway  Additional Equipment: None  Intra-op Plan:   Post-operative Plan:   Informed Consent: I have reviewed the patients History and Physical, chart, labs and discussed the procedure including the risks, benefits and alternatives for the proposed anesthesia with the patient or authorized representative who has indicated his/her understanding and acceptance.       Plan Discussed with: CRNA  Anesthesia Plan Comments:       Anesthesia Quick Evaluation

## 2019-08-11 NOTE — Discharge Summary (Signed)
Postpartum Discharge Summary     Patient Name: Wendy Vincent DOB: 1975/09/13 MRN: [id number]  Date of admission: 08/11/2019 Delivering Provider: Chancy Milroy   Date of discharge: 08/14/2019  Admitting diagnosis: Labor and delivery, indication for care [O75.9] Intrauterine pregnancy: [redacted]w[redacted]d    Secondary diagnosis:  Active Problems:   History of preterm delivery, currently pregnant   AMA (advanced maternal age) multigravida 35+   IUGR (intrauterine growth restriction) affecting care of mother, third trimester, not applicable or unspecified fetus   Previous child with anomaly, antepartum   Unwanted fertility   Gestational diabetes   Labor and delivery, indication for care  Additional problems: None     Discharge diagnosis: Preterm Pregnancy Delivered and GDM A1                                                                                                Post partum procedures:None  Augmentation: NA  Complications: None  Hospital course:  Sceduled C/S   44y.o. yo [id number] [id number] admitted to the hospital 08/11/2019 for scheduled cesarean section with the following indication:Malpresentation. IOL at 36w by MFM recommendation due to severe fetal growth restriction. Membrane Rupture Time/Date: 7:57 AM ,08/11/2019   Patient delivered a Viable infant.08/11/2019  Details of operation can be found in separate operative note. Filshie clips placed bilaterally. Pateint had an uncomplicated postpartum course. Fasting AM glucose 74. She is ambulating, tolerating a regular diet, passing flatus, and urinating well. Patient is discharged home in stable condition on  08/14/19        Delivery time: 7:58 AM    Magnesium Sulfate received: No BMZ received: Yes; 11/19 and 11/20 Rhophylac:No MMR:No Transfusion:No  Physical exam  Vitals:   08/13/19 0634 08/13/19 1700 08/13/19 2110 08/14/19 0541  BP: 110/78 129/66 117/61 123/73  Pulse: 70 67 73 71  Resp: _0 Temp: 98 F (36.7  C) 98.2 F (36.8 C) 98.1 F (36.7 C) 98.4 F (36.9 C)  TempSrc: Oral  Oral Oral  SpO2: 100%  100% 97%  Weight:      Height:       General: alert, cooperative and no distress Lochia: appropriate Uterine Fundus: firm Incision: Healing well with no significant drainage, No significant erythema, Dressing is clean, dry, and intact DVT Evaluation: No evidence of DVT seen on physical exam. Labs: Lab Results  Component Value Date   WBC 8.8 08/12/2019   HGB 10.1 (L) 08/12/2019   HCT 30.9 (L) 08/12/2019   MCV 89.6 08/12/2019   PLT 236 08/12/2019   CMP Latest Ref Rng & Units 08/11/2019  Glucose 70 - 99 -  BUN 6 - 20 mg/dL -  Creatinine 0.44 - 1.00 mg/dL 0.36(L)  Sodium 135 - 145 mmol/L -  Potassium 3.5 - 5.1 mmol/L -  Chloride 98 - 111 mmol/L -  CO2 22 - 32 mmol/L -  Calcium 8.9 - 10.3 mg/dL -  Total Protein 6.5 - 8.1 g/dL -  Total Bilirubin 0.3 - 1.2 mg/dL -  Alkaline Phos 38 - 126 U/L -  AST 15 - 41  U/L -  ALT 14 - 54 U/L -    Discharge instruction: per After Visit Summary and "Baby and Me Booklet".  After visit meds:  Allergies as of 08/14/2019   No Known Allergies     Medication List    STOP taking these medications   Accu-Chek Guide test strip Generic drug: glucose blood   Accu-Chek Nano SmartView w/Device Kit   Accu-Chek Softclix Lancets lancets     TAKE these medications   acetaminophen 325 MG tablet Commonly known as: TYLENOL Take 2 tablets (650 mg total) by mouth every 6 (six) hours as needed for mild pain (temperature > 101.5.).   ibuprofen 800 MG tablet Commonly known as: ADVIL Take 1 tablet (800 mg total) by mouth every 8 (eight) hours.   oxyCODONE 5 MG immediate release tablet Commonly known as: Oxy IR/ROXICODONE Take 1-2 tablets (5-10 mg total) by mouth every 4 (four) hours as needed for moderate pain.   prenatal multivitamin Tabs tablet Take 1 tablet by mouth daily at 12 noon.   senna-docusate 8.6-50 MG tablet Commonly known as:  Senokot-S Take 2 tablets by mouth daily. Start taking on: August 15, 2019       Diet: routine diet  Activity: Advance as tolerated. Pelvic rest for 6 weeks.   Outpatient follow up:4 weeks Follow up Appt:No future appointments. Follow up Visit:   Please schedule this patient for Postpartum visit in: 4 weeks with the following provider: Any provider For C/S patients schedule nurse incision check in weeks 2 weeks: yes High risk pregnancy complicated by: GDM Delivery mode:  CS Anticipated Birth Control:  BTL done PP Procedures needed: GTT and incision check  Schedule Integrated BH visit: no      Newborn Data: Live born female  Birth Weight: 3 lb 0.3 oz (1370 g) APGAR (1 MIN): 7   APGAR (5 MINS): 8   APGAR (10 MINS):    Newborn Delivery   Birth date/time: 08/11/2019 07:58:00 Delivery type: C-Section, Low Transverse Trial of labor: No C-section categorization: Primary      Baby Feeding: Bottle and Breast Disposition:NICU   08/14/2019 Chelsea N Fair, MD   

## 2019-08-11 NOTE — Anesthesia Postprocedure Evaluation (Signed)
Anesthesia Post Note  Patient: Moniqua Engebretsen  Procedure(s) Performed: CESAREAN SECTION WITH BILATERAL TUBAL LIGATION (N/A )     Patient location during evaluation: PACU Anesthesia Type: Spinal Level of consciousness: awake and alert and oriented Pain management: pain level controlled Vital Signs Assessment: post-procedure vital signs reviewed and stable Respiratory status: spontaneous breathing, nonlabored ventilation and respiratory function stable Cardiovascular status: blood pressure returned to baseline Postop Assessment: no apparent nausea or vomiting, spinal receding, no headache and no backache Anesthetic complications: no    Last Vitals:  Vitals:   08/11/19 1000 08/11/19 1026  BP: 95/63 (!) 99/56  Pulse: 64 71  Resp: (!) 23 18  Temp: 36.6 C 36.4 C  SpO2: 96%     Last Pain:  Vitals:   08/11/19 1030  TempSrc:   PainSc: 0-No pain   Pain Goal:    LLE Motor Response: Purposeful movement (08/11/19 1000)   RLE Motor Response: Purposeful movement (08/11/19 1000)       Epidural/Spinal Function Cutaneous sensation: (P) Able to Discern Pressure (08/11/19 1030), Patient able to flex knees: (P) Yes (08/11/19 1030), Patient able to lift hips off bed: (P) No (08/11/19 1030), Back pain beyond tenderness at insertion site: (P) No (08/11/19 1030), Progressively worsening motor and/or sensory loss: (P) No (08/11/19 1030), Bowel and/or bladder incontinence post epidural: (P) No (08/11/19 1030)  Kaylyn Layer

## 2019-08-11 NOTE — Lactation Note (Signed)
This note was copied from a baby's chart. Lactation Consultation Note  Patient Name: Wendy Vincent FGHWE'X Date: 08/11/2019 Reason for consult: Initial assessment;NICU baby;Infant < 6lbs;Late-preterm 34-36.6wks  LC in to visit with P6 Mom of LPTI in the NICU.  Baby IUGR and weighs <4 lbs.  Baby is 8 hrs old.  Baby on HFNC.  Mom was set up with a DEBP and has pumped one time.  Mom states she "has no milk" and prefers the hand pump over the electric pump.  Talked for a while about the purpose and benefit of double pumping using a hospital grade pump.  Explained that once milk supply is established at 2-3 weeks, using a hand pump may be her preference.  Mom has WIC, referral faxed to [facility] in [place].  Mom also aware of pump in baby's room while she is in NICU.  Reviewed breast massage and hand expression, colostrum glistening on nipple.    Assisted Mom to pump on initiation setting after breast massage.  Colostrum containers given to Mom.    Encouraged Mom to double pump every 2-3 hrs during the day and 4 hrs at night.    Lactation brochure left in room.  NICU booklet given to Mom.  Informed Mom to IP and OP lactation support available to her. To ask for help prn.  Interventions Interventions: Breast feeding basics reviewed;Skin to skin;Breast massage;Hand express;DEBP  Lactation Tools Discussed/Used Tools: Pump;Flanges Flange Size: 27 Breast pump type: Double-Electric Breast Pump WIC Program: Yes Pump Review: Setup, frequency, and cleaning;Milk Storage Initiated by:: RN Date initiated:: 08/11/19   Consult Status Consult Status: Follow-up Date: 08/12/19 Follow-up type: In-patient    Wendy Vincent 08/11/2019, 4:50 PM

## 2019-08-11 NOTE — Op Note (Signed)
Wendy Vincent PROCEDURE DATE: 08/11/2019  PREOPERATIVE DIAGNOSES: Intrauterine pregnancy at [redacted]w[redacted]d weeks gestation; malpresentation: frank breech; undesired fertility  POSTOPERATIVE DIAGNOSES: The same  PROCEDURE: Primary Low Transverse Cesarean Section, Bilateral Tubal Sterilization using Filshie clips  SURGEON:  Dr. Arlina Robes - Primary Barrington Ellison, MD - Fellow  ANESTHESIOLOGIST: Dr. Daiva Huge  INDICATIONS: Wendy Vincent is a 44 y.o. [id number] at [redacted]w[redacted]d here for cesarean section and bilateral tubal sterilization secondary to the indications listed under preoperative diagnoses; please see preoperative note for further details.  The risks of surgery were discussed with the patient including but were not limited to: bleeding which may require transfusion or reoperation; infection which may require antibiotics; injury to bowel, bladder, ureters or other surrounding organs; injury to the fetus; need for additional procedures including hysterectomy in the event of a life-threatening hemorrhage; placental abnormalities wth subsequent pregnancies, incisional problems, thromboembolic phenomenon and other postoperative/anesthesia complications.  Patient also desires permanent sterilization.  Other reversible forms of contraception were discussed with patient; she declines all other modalities. Risks of procedure discussed with patient including but not limited to: risk of regret, permanence of method, bleeding, infection, injury to surrounding organs and need for additional procedures.  Failure risk of 1-2% with increased risk of ectopic gestation if pregnancy occurs was also discussed with patient.  Also discussed possibility of post-tubal pain syndrome. The patient concurred with the proposed plan, giving informed written consent for the procedures.    FINDINGS:  Viable female infant in frank breech presentation. Clear amniotic fluid.  Intact placenta, three vessel cord.  Normal uterus, fallopian tubes and  ovaries bilaterally. Fallopian tubes were sterilized bilaterally. Weight: 1370g APGAR (1 MIN): 7 APGAR (5 MINS): 8  APGAR (10 MINS):    ANESTHESIA: Spinal INTRAVENOUS FLUIDS: 2200 ml ESTIMATED BLOOD LOSS: 329 ml URINE OUTPUT:  200 ml SPECIMENS: Placenta sent to pathology COMPLICATIONS: None immediate  PROCEDURE IN DETAIL:  The patient preoperatively received intravenous antibiotics and had sequential compression devices applied to her lower extremities.   She was then taken to the operating room where spinal anesthesia was administered and was found to be adequate. She was then placed in a dorsal supine position with a leftward tilt, and prepped and draped in a sterile manner.  A foley catheter was placed into her bladder and attached to constant gravity.  After an adequate timeout was performed, a Pfannenstiel skin incision was made with scalpel and carried through to the underlying layer of fascia. The fascia was incised in the midline, and this incision was extended bilaterally using the Mayo scissors.  Kocher clamps were applied to the superior aspect of the fascial incision and the underlying rectus muscles were dissected off bluntly.  A similar process was carried out on the inferior aspect of the fascial incision. The rectus muscles were separated in the midline and the peritoneum was entered bluntly. The Alexis self-retaining retractor was introduced into the abdominal cavity.  Attention was turned to the lower uterine segment where a low transverse hysterotomy was made with a scalpel and extended bilaterally bluntly.  The infant was successfully delivered, the cord was clamped prior to one minute, and the infant was handed over to the awaiting neonatology team. Uterine massage was then administered, and the placenta delivered intact with a three-vessel cord. The uterus was then cleared of clots and debris.  The hysterotomy was closed with 1 Chromic in a running locked fashion, and an mbricating  layer was also placed with 1 Chromic.  Figure-of-eight 1 Chromic serosal  stitches were placed to help with hemostasis.  Attention was then turned to the fallopian tubes and Filshie clips were placed about 3 cm from the cornua of both fallopian tubes, with care given to incorporate the underlying mesosalpinx on both sides, allowing for bilateral tubal sterilization. Excellent hemostasis was noted. The pelvis was cleared of all clot and debris. Hemostasis was confirmed on all surfaces.  The retractor was removed.  The peritoneum was closed with a 2-0 Vicryl running stitch and the rectus muscles were reapproximated using 1 Chromic interrupted stitches. The fascia was then closed using 0 Vicryl in a running fashion.  The subcutaneous layer was irrigated, reapproximated with 2-0 plain gut interrupted stitches, and the skin was closed with a 4-0 Vicryl subcuticular stitch. The patient tolerated the procedure well. Sponge, instrument and needle counts were correct x 3.  She was taken to the recovery room in stable condition.   Jerilynn Birkenhead, MD [facility] Family Medicine Fellow, [facility] for [employer], [facility]

## 2019-08-11 NOTE — H&P (Signed)
Obstetric Preoperative History and Physical  Wendy Vincent is a 44 y.o. G8P4125 with IUP at [redacted]w[redacted]d presenting for scheduled cesarean section.  Reports good fetal movement, no bleeding, no contractions, no leaking of fluid.  No acute preoperative concerns.    Cesarean Section Indication: Breech presentation declines ECV; severe fetal growth restriction as indication for 36w delivery  Prenatal Course Source of Care: [facility] with onset of care at 27 weeks Pregnancy complications or risks: Patient Active Problem List   Diagnosis Date Noted  . Gestational diabetes 08/08/2019  . Unwanted fertility 06/14/2019  . Current smoker 06/12/2019  . History of preterm delivery, currently pregnant 06/12/2019  . History of gestational diabetes in prior pregnancy, currently pregnant 06/12/2019  . AMA (advanced maternal age) multigravida 35+ 06/12/2019  . Supervision of high-risk pregnancy 06/12/2019  . Known fetal anomaly, antepartum 06/12/2019  . Small for gestational age 06/12/2019  . IUGR (intrauterine growth restriction) affecting care of mother, third trimester, not applicable or unspecified fetus 06/12/2019  . Previous child with anomaly, antepartum 06/12/2019   She plans to breastfeed She desires bilateral tubal ligation for postpartum contraception.   Prenatal labs and studies: ABO, Rh: O/Positive/-- (08/06 0000) Antibody: Negative (08/06 0000) Rubella: Immune (08/06 0000) RPR: Non Reactive (01/06 1455)  HBsAg: Negative (08/06 0000)  HIV: Non Reactive (01/06 1455)  GBS:--/Negative (01/12 1222) Early 3 hour WNL however and patient with abnormal 1 hour GTT on 1/6 Genetic screening normal Anatomy US: Normal with exception of below -Early-onset fetal growth restriction is associated with higher  likelihood of fetal demise. -Possibility of dwarfism (her history) cannot be ruled out on  ultrasound.  Prenatal Transfer Tool  Maternal Diabetes: Yes:  Diabetes Type:  Diet controlled Genetic  Screening: Normal Maternal Ultrasounds/Referrals: IUGR Fetal Ultrasounds or other Referrals:  Fetal echo WNL Maternal Substance Abuse:  No Significant Maternal Medications:  None Significant Maternal Lab Results: Group B Strep negative  Past Medical History:  Diagnosis Date  . Gestational diabetes   . Medical history non-contributory   . Preterm labor     Past Surgical History:  Procedure Laterality Date  . NO PAST SURGERIES      OB History  Gravida Para Term Preterm AB Living  8 5 4 1 2 5  SAB TAB Ectopic Multiple Live Births  2       5    # Outcome Date GA Lbr Len/2nd Weight Sex Delivery Anes PTL Lv  8 Current           7 SAB 09/2018          6 Term 11/17/14 [redacted]w[redacted]d  3629 g M Vag-Spont EPI N LIV     Complications: Gestational diabetes  5 SAB 12/2013          4 Preterm 12/22/09 [redacted]w[redacted]d  1814 g M Vag-Spont None Y LIV     Birth Comments: PTL  3 Term 08/26/04 [redacted]w[redacted]d   M Vag-Spont  Y LIV  2 Term 01/01/99 [redacted]w[redacted]d  3317 g F Vag-Spont None N LIV  1 Term 12/06/95 [redacted]w[redacted]d  3175 g M Vag-Spont EPI N LIV    Social History   Socioeconomic History  . Marital status: Single    Spouse name: Not on file  . Number of children: Not on file  . Years of education: Not on file  . Highest education level: Not on file  Occupational History  . Not on file  Tobacco Use  . Smoking status: Current Every Day Smoker      Packs/day: 0.50    Types: Cigarettes  . Smokeless tobacco: Never Used  Substance and Sexual Activity  . Alcohol use: No  . Drug use: No  . Sexual activity: Not Currently    Birth control/protection: None  Other Topics Concern  . Not on file  Social History Narrative  . Not on file   Social Determinants of Health   Financial Resource Strain:   . Difficulty of Paying Living Expenses: Not on file  Food Insecurity:   . Worried About Charity fundraiser in the Last Year: Not on file  . Ran Out of Food in the Last Year: Not on file  Transportation Needs:   . Lack of  Transportation (Medical): Not on file  . Lack of Transportation (Non-Medical): Not on file  Physical Activity:   . Days of Exercise per Week: Not on file  . Minutes of Exercise per Session: Not on file  Stress:   . Feeling of Stress : Not on file  Social Connections:   . Frequency of Communication with Friends and Family: Not on file  . Frequency of Social Gatherings with Friends and Family: Not on file  . Attends Religious Services: Not on file  . Active Member of Clubs or Organizations: Not on file  . Attends Archivist Meetings: Not on file  . Marital Status: Not on file    Family History  Problem Relation Age of Onset  . Heart disease Mother     Facility-Administered Medications Prior to Admission  Medication Dose Route Frequency Provider Last Rate Last Admin  . betamethasone acetate-betamethasone sodium phosphate (CELESTONE) injection 12 mg  12 mg Intramuscular Once Tama High, MD       Medications Prior to Admission  Medication Sig Dispense Refill Last Dose  . Accu-Chek Softclix Lancets lancets Use as instructed (Patient not taking: Reported on 08/08/2019) 100 each 12   . Blood Glucose Monitoring Suppl (ACCU-CHEK NANO SMARTVIEW) w/Device KIT USE AS DIRECTED 4 TIMES DAILY TO CHECK BLOOD SUGAR FOR FASTING, AND 2 HOURS AFTER BREAKFAST, LUNCH AND DINNER. 1 kit 0   . glucose blood (ACCU-CHEK GUIDE) test strip Use as instructed QID (Patient not taking: Reported on 08/08/2019) 100 each 12   . Prenatal Vit-Fe Fumarate-FA (PREPLUS) 27-1 MG TABS Take 1 tablet by mouth daily.       No Known Allergies  Review of Systems: Pertinent items noted in HPI and remainder of comprehensive ROS otherwise negative.  Physical Exam: LMP 12/02/2018  FHR by Doppler: 150 bpm CONSTITUTIONAL: Well-developed, well-nourished female in no acute distress.  HENT:  Normocephalic, atraumatic, External right and left ear normal.  EYES: Conjunctivae and EOM are normal. No scleral icterus.  NECK:  Normal range of motion, supple, no masses SKIN: Skin is warm and dry. No rash noted. Not diaphoretic. No erythema. No pallor. [place]: Alert and oriented to person, place, and time. Normal reflexes, muscle tone coordination. No cranial nerve deficit noted. PSYCHIATRIC: Normal mood and affect. Normal behavior. Normal judgment and thought content. CARDIOVASCULAR: Normal heart rate noted RESPIRATORY: Effort and breath sounds normal, no problems with respiration noted ABDOMEN: Soft, nontender, nondistended, gravid.  PELVIC: Deferred MUSCULOSKELETAL: Normal range of motion. No edema and no tenderness. 2+ distal pulses.   Pertinent Labs/Studies:   Results for orders placed or performed during the hospital encounter of 08/09/19 (from the past 72 hour(s))  SARS CORONAVIRUS 2 (TAT 6-24 HRS) Nasopharyngeal Nasopharyngeal Swab     Status: None   Collection Time: 08/09/19  8:15 AM   Specimen: Nasopharyngeal Swab  Result Value Ref Range   SARS Coronavirus 2 NEGATIVE NEGATIVE    Comment: (NOTE) SARS-CoV-2 target nucleic acids are NOT DETECTED. The SARS-CoV-2 RNA is generally detectable in upper and lower respiratory specimens during the acute phase of infection. Negative results do not preclude SARS-CoV-2 infection, do not rule out co-infections with other pathogens, and should not be used as the sole basis for treatment or other patient management decisions. Negative results must be combined with clinical observations, patient history, and epidemiological information. The expected result is Negative. Fact Sheet for Patients: SugarRoll.be Fact Sheet for Healthcare Providers: https://www.woods-mathews.com/ This test is not yet approved or cleared by the [place] FDA and  has been authorized for detection and/or diagnosis of SARS-CoV-2 by FDA under an Emergency Use Authorization (EUA). This EUA will remain  in effect (meaning this test can be used)  for the duration of the COVID-19 declaration under Section 56 4(b)(1) of the Act, 21 U.S.C. section 360bbb-3(b)(1), unless the authorization is terminated or revoked sooner. Performed at [facility], [place] [address], [place], [place] [address]     Assessment and Plan: Wendy Vincent is a 44 y.o. V6H2094 at 50w0dbeing admitted for cesarean section. The risks of cesarean section were discussed with the patient including but were not limited to: bleeding which may require transfusion or reoperation; infection which may require antibiotics; injury to bowel, bladder, ureters or other surrounding organs; injury to the fetus; need for additional procedures including hysterectomy in the event of a life-threatening hemorrhage; placental abnormalities wth subsequent pregnancies, incisional problems, thromboembolic phenomenon and other postoperative/anesthesia complications.  Patient also desires permanent sterilization.  Other reversible forms of contraception were discussed with patient; she declines all other modalities. Risks of procedure discussed with patient including but not limited to: risk of regret, permanence of method, bleeding, infection, injury to surrounding organs and need for additional procedures.  Failure risk of about 1% with increased risk of ectopic gestation if pregnancy occurs was also discussed with patient.  Also discussed possibility of post-tubal pain syndrome. The patient concurred with the proposed plan, giving informed written consent for the procedures.  Patient has been NPO since midnight she will remain NPO for procedure. Anesthesia and OR aware.  Preoperative prophylactic antibiotics and SCDs ordered on call to the OR.  To OR when ready.  Pregnancy Complications: Severe fetal growth restriction for which induction was recommended at 36w however patient presented in breech presentation and therefore c-section is indicated. S/p BMZ on 11/19 and 11/20. Patient is AMA and also  GHenry Will draw fasting AM glucose.  Contraception: BTL; patient signed paperwork 11/18 MOF: Breast  CBarrington Ellison MD ONovamed [facility] Surgery[facility] Medicine Fellow, [facility]ont[facility] Servicefor [facility] CL[facility]

## 2019-08-11 NOTE — Transfer of Care (Signed)
Immediate Anesthesia Transfer of Care Note  Patient: [name]  Procedure(s) Performed: CESAREAN SECTION WITH BILATERAL TUBAL LIGATION (N/A )  Patient Location: PACU  Anesthesia Type:Spinal  Level of Consciousness: awake, alert  and oriented  Airway & Oxygen Therapy: Patient Spontanous Breathing  Post-op Assessment: Report given to RN and Post -op Vital signs reviewed and stable  Post vital signs: Reviewed and stable  Last Vitals:  Vitals Value Taken Time  BP    Temp    Pulse 77 08/11/19 0839  Resp 8 08/11/19 0839  SpO2 96 % 08/11/19 0839  Vitals shown include unvalidated device data.  Last Pain:  Vitals:   08/11/19 0615  TempSrc: Oral         Complications: No apparent anesthesia complications

## 2019-08-11 NOTE — Anesthesia Procedure Notes (Signed)
Spinal  Patient location during procedure: OR Start time: 08/11/2019 7:38 AM End time: 08/11/2019 7:41 AM Staffing Performed: anesthesiologist  Anesthesiologist: Kaylyn Layer, MD Preanesthetic Checklist Completed: patient identified, IV checked, risks and benefits discussed, monitors and equipment checked, pre-op evaluation and timeout performed Spinal Block Patient position: sitting Prep: DuraPrep and site prepped and draped Patient monitoring: heart rate, continuous pulse ox and blood pressure Approach: midline Location: L3-4 Injection technique: single-shot Needle Needle type: Pencan  Needle gauge: 24 G Needle length: 10 cm Assessment Sensory level: T4 Additional Notes Risks, benefits, and alternative discussed. Patient gave consent to procedure. Prepped and draped in sitting position. Clear CSF obtained after one needle pass. Positive terminal aspiration. No pain or paraesthesias with injection. Patient tolerated procedure well. Vital signs stable. Amalia Greenhouse, MD

## 2019-08-11 NOTE — Progress Notes (Signed)
Reviewed pt case with Donell Sievert MD, pt had vomited at 1345, last had Zofran at 0804 in OR and had an order on the unit for Zofran 4 mg IV every 8 hrs as needed.  Dr Leary Roca stated that I could go ahead and give Zofran 4mg  IV now since it had been at least 6 hours.  Also received an order for Toradol 30mg  IV once for pain since pt was vomiting and not able to tolerate PO pain meds at this time.  Toradol given at 1428, will continue to assess.

## 2019-08-12 LAB — CBC
HCT: 30.9 % — ABNORMAL LOW (ref 36.0–46.0)
Hemoglobin: 10.1 g/dL — ABNORMAL LOW (ref 12.0–15.0)
MCH: 29.3 pg (ref 26.0–34.0)
MCHC: 32.7 g/dL (ref 30.0–36.0)
MCV: 89.6 fL (ref 80.0–100.0)
Platelets: 236 10*3/uL (ref 150–400)
RBC: 3.45 MIL/uL — ABNORMAL LOW (ref 3.87–5.11)
RDW: 13.7 % (ref 11.5–15.5)
WBC: 8.8 10*3/uL (ref 4.0–10.5)
nRBC: 0 % (ref 0.0–0.2)

## 2019-08-12 LAB — GLUCOSE, CAPILLARY: Glucose-Capillary: 74 mg/dL (ref 70–99)

## 2019-08-12 NOTE — Progress Notes (Addendum)
Subjective: Post-Op Day 1: Cesarean Delivery Patient reports had some nausea/vomiting last night but now feeling better. Does report some pain but overall tolerable. She has been ambulating without difficulty. Tolerating PO. Passing flatus. Lochia appropriate.  Objective: Vital signs in last 24 hours: Temp:  [96.9 F (36.1 C)-98.4 F (36.9 C)] 98.4 F (36.9 C) (01/16 0600) Pulse Rate:  [63-88] 88 (01/16 0600) Resp:  [8-53] 16 (01/16 0600) BP: (90-129)/(48-66) 107/63 (01/16 0600) SpO2:  [96 %-100 %] 100 % (01/16 0600)  Physical Exam:  General: alert, cooperative and appears stated age 89: appropriate Uterine Fundus: firm Incision: healing well, no significant drainage, no dehiscence, no significant erythema DVT Evaluation: No evidence of DVT seen on physical exam.  Recent Labs    08/11/19 0621 08/12/19 0540  HGB 12.4 10.1*  HCT 38.2 30.9*    Assessment/Plan: Status post Cesarean section. Doing well postoperatively.  Continue current care. Plan for discharge tomorrow or 1/18 per patient request; she may want to DC tomorrow so she can come and go and be with her other children; infant rooming in as NICU patient AM CBC appropriate Planning to breastfeed S/p BTL Fasting AM glucose 74  Reannon Candella N Mohammedali Bedoy 08/12/2019, 6:15 AM

## 2019-08-12 NOTE — Lactation Note (Signed)
This note was copied from a baby's chart. Lactation Consultation Note  Patient Name: Wendy Vincent TUUEK'C Date: 08/12/2019 Reason for consult: Follow-up assessment;NICU baby;Infant < 6lbs;Mother's request;Late-preterm 34-36.6wks  RN called for assistance because mom has decided to start pumping again. However her flanges were too small, LC re-sized mom to a flange # 30, disposed of # 27 flanges since mom won't be using them. RN Romana Juniper will also getting her some coconut oil for her to use prior pumping to help with lubrication.  Mom concerned because she had "lots of milk" with her other babies, but not with this one, she said that her breasts were already full by the time she was about to deliver and constantly leaked during her other pregnancies, but not on this one. She reported (+) breast changes though.  Feeding plan:  1. Encouraged mom to start pumping consistently with # 30 flanges, ideally 8 times/24 hours 2. She was also encouraged to save any drops of EBM she may get even if it's only a couple, to rub them on baby's mouth 3. She'll use coconut oil prior pumping  Mom reported all questions and concerns were answered, she's aware of LC OP services and will call PRN.    Maternal Data    Feeding    LATCH Score                   Interventions Interventions: Breast feeding basics reviewed;DEBP;Coconut oil  Lactation Tools Discussed/Used Tools: Pump;Flanges;Coconut oil Flange Size: 30 Breast pump type: Double-Electric Breast Pump   Consult Status Consult Status: PRN Follow-up type: In-patient    Belisa Eichholz Venetia Constable 08/12/2019, 3:32 PM

## 2019-08-12 NOTE — Lactation Note (Signed)
This note was copied from a baby's chart. Lactation Consultation Note  Patient Name: Girl Keely Drennan BSJGG'[id number] Date: 08/12/2019 Reason for consult: Follow-up assessment;NICU baby;Infant < 6lbs;Late-preterm 34-36.6wks(IUGR)  Visited with mom of a 30 hours old LPI female < 4 lbs, baby is currently on donor milk fortified to 24 calories/oz. Mom has a DEBP set up in the room, but she hasn't been pumping at all neither with the DEBP or the hand pump. She told LC that "nothing came out" when she tried pumping yesterday, explained to mom that the purpose of early pumping is mainly for breast stimulation and not to get volume, she voiced understanding.  Mom holding baby when entering the room, baby is also receiving oxygen in addition to gavage tube. Reviewed benefits of premature milk, pumping schedule, supply and demand and how frequent milk removal protects her milk supply and prevents engorgement.  Feeding plan:  1. Encouraged mom to start pumping consistently, ideally 8 times/24 hours 2. She was also encouraged to save any drops of EBM she may get even if it's only a couple, to rub them on baby's mouth  Dad present and supportive, both parents spoke Spanish to [facility]. They reported all questions and concerns were answered, they're both aware of LC OP services and will call PRN.   Maternal Data    Feeding Feeding Type: Donor Breast Milk  LATCH Score                   Interventions Interventions: Breast feeding basics reviewed  Lactation Tools Discussed/Used     Consult Status Consult Status: PRN Follow-up type: Call as needed    Perle Gibbon Venetia Constable 08/12/2019, 2:16 PM

## 2019-08-13 MED ORDER — PNEUMOCOCCAL VAC POLYVALENT 25 MCG/0.5ML IJ INJ
0.5000 mL | INJECTION | INTRAMUSCULAR | Status: AC
  Administered 2019-08-14: 0.5 mL via INTRAMUSCULAR
  Filled 2019-08-13: qty 0.5

## 2019-08-13 MED ORDER — POLYETHYLENE GLYCOL 3350 17 G PO PACK
17.0000 g | PACK | Freq: Every day | ORAL | Status: DC
  Administered 2019-08-13 – 2019-08-14 (×2): 17 g via ORAL
  Filled 2019-08-13 (×2): qty 1

## 2019-08-13 NOTE — Progress Notes (Signed)
Subjective: Post-Op Day 2: Cesarean Delivery Patient reports had some nausea/vomiting last night but now feeling better. Pain is well-controlled. She has been ambulating without difficulty. Tolerating PO. Passing flatus. Lochia appropriate. Desires to discharge tomorrow rather than today.   Objective: Vital signs in last 24 hours: Temp:  [97.8 F (36.6 C)-98.8 F (37.1 C)] 98 F (36.7 C) (01/17 0634) Pulse Rate:  [62-85] 70 (01/17 0634) Resp:  [16-20] 18 (01/17 0634) BP: (96-114)/(54-86) 110/78 (01/17 0634) SpO2:  [96 %-100 %] 100 % (01/17 2409)  Physical Exam:  General: alert, cooperative and appears stated age Lochia: appropriate Uterine Fundus: firm Incision: healing well, no significant drainage, no dehiscence, no significant erythema DVT Evaluation: No evidence of DVT seen on physical exam.  Recent Labs    08/11/19 0621 08/12/19 0540  HGB 12.4 10.1*  HCT 38.2 30.9*    Assessment/Plan: Status post Cesarean section. Doing well postoperatively.  Continue current care. Plan for discharge tomorrow per patient request; infant will likely have to stay in NICU beyond tomorrow Planning to breastfeed S/p BTL Fasting AM glucose 74 on 1/16  Rainbow Salman N Ismael Karge 08/13/2019, 6:53 AM

## 2019-08-13 NOTE — Lactation Note (Signed)
This note was copied from a baby's chart. Lactation Consultation Note  Patient Name: Wendy Vincent CLEXN'T Date: 08/13/2019 Reason for consult: Follow-up assessment;NICU baby;Late-preterm 34-36.6wks  NICU mom follow up visit. LC entered the room, mom was hand pumping on the bed. Drops of colostrum was visible in the bottle. LC praised mom for her efforts and noticed mom was teary-eyed and stated she feels like she does not have any milk in her breast for her baby. After previous pregnancies, she was able to breastfeed and lots of milk was visible after delivery. Mom stated that she exclusively breastfed 4 previous children for 6 months each. LC reassured mom she has milk based on the pumped breastmilk seen in the bottle. LC encouraged mom to continue her great efforts and pump every 2-3 hours. Mom stated she would and she looks forward to seeing more milk.  LC encouraged mom to hand express and practice breast massages before pumping. MOB stated she plans to continue pumping every 2 hours and she is aware of lactation services if she needs it.  No further questions or concerns from the patient at this time.   Maternal Data Does the patient have breastfeeding experience prior to this delivery?: Yes  Feeding Feeding Type: Donor Breast Milk  LATCH Score                   Interventions Interventions: Hand pump;Hand express  Lactation Tools Discussed/Used Pump Review: Setup, frequency, and cleaning   Consult Status      Michel Bickers 08/13/2019, 3:08 PM

## 2019-08-14 ENCOUNTER — Telehealth: Payer: Self-pay | Admitting: Obstetrics and Gynecology

## 2019-08-14 ENCOUNTER — Encounter: Payer: Medicaid Other | Admitting: Family Medicine

## 2019-08-14 LAB — SURGICAL PATHOLOGY

## 2019-08-14 MED ORDER — OXYCODONE HCL 5 MG PO TABS: 5.0000 mg | ORAL_TABLET | ORAL | 0 refills | Status: AC | PRN

## 2019-08-14 MED ORDER — ACETAMINOPHEN 325 MG PO TABS: 650.0000 mg | ORAL_TABLET | Freq: Four times a day (QID) | ORAL | 0 refills | Status: AC | PRN

## 2019-08-14 MED ORDER — IBUPROFEN 800 MG PO TABS: 800.0000 mg | ORAL_TABLET | Freq: Three times a day (TID) | ORAL | 0 refills | Status: DC

## 2019-08-14 MED ORDER — SENNOSIDES-DOCUSATE SODIUM 8.6-50 MG PO TABS: 2.0000 | ORAL_TABLET | ORAL | 0 refills | Status: AC

## 2019-08-14 NOTE — Telephone Encounter (Signed)
Called the patient's mother Maralyn Sago to offer the lacation appointment per the referral. Left a voicemail message indicating please call our office to schedule the appointment for Wendy Vincent concerning the lactation referral.

## 2019-08-14 NOTE — Lactation Note (Signed)
This note was copied from a baby's chart. Lactation Consultation Note  Patient Name: Wendy Vincent ZTAEW'Y Date: 08/14/2019 Reason for consult: Follow-up assessment;NICU baby;Late-preterm 34-36.6wks;Infant < 6lbs  P6 mother whose infant is now 63 hours old.  This is a LPTI at 36+0 weeks weighing <4 lbs and in the NICU.  Mother has breast fed her other children for approximately 6 months each.    Mother has been pumping every two hours and was frustrated in the beginning due to her low volume obtained with pumping.  She is now "much better" since she has started producing more milk.  She currently has 15 mls of EBM in the refrigerator from her last pumping session.  Praised mother and encouraged her to continue practicing hand expression before/after pumping to help obtain and maintain a good milk supply.  Mother denies pain with pumping.    Suggested mother call for any questions/concerns she may have.  Mother verbalized understanding.  Father present.    Maternal Data    Feeding    LATCH Score                   Interventions    Lactation Tools Discussed/Used     Consult Status Consult Status: Follow-up Date: 08/15/19 Follow-up type: In-patient    Wendy Vincent 08/14/2019, 9:37 AM

## 2019-08-15 ENCOUNTER — Ambulatory Visit: Payer: Self-pay

## 2019-08-15 NOTE — Lactation Note (Signed)
This note was copied from a baby's chart. Lactation Consultation Note  Patient Name: Girl Katyana Trolinger [id number] Date: 08/15/2019 Reason for consult: Follow-up assessment   LC Follow Up Visit:  Rounding in the NICU: parents are not currently present.      Consult Status Consult Status: PRN Date: 08/15/19 Follow-up type: In-patient    Corian Handley R Hero Kulish 08/15/2019, 10:04 AM

## 2019-08-16 ENCOUNTER — Ambulatory Visit: Payer: Self-pay

## 2019-08-16 NOTE — Lactation Note (Signed)
This note was copied from a baby's chart. Lactation Consultation Note: LC follow up to mothers room. Mother not present in the room. Staff nurse reports that she went home. Suggested that staff nurse inform mother of visit and encourage to page as needed.   Patient Name: Girl Katerra Ingman [id number] Date: 08/16/2019     Maternal Data    Feeding Feeding Type: Donor Breast Milk  LATCH Score                   Interventions    Lactation Tools Discussed/Used     Consult Status      Michel Bickers 08/16/2019, 1:57 PM

## 2019-08-19 ENCOUNTER — Ambulatory Visit: Payer: Self-pay

## 2019-08-19 NOTE — Lactation Note (Signed)
This note was copied from a baby's chart. Lactation Consultation Note  Patient Name: Wendy Vincent [id number] Date: 08/19/2019 Reason for consult: Follow-up assessment  LC Follow Up Visit:  Attempted to visit with mother, however, she is not present.  Spoke with RN who will call me as needed.   Consult Status Consult Status: PRN Date: 08/19/19 Follow-up type: Call as needed    Wendy Vincent 08/19/2019, 10:27 AM

## 2019-08-20 ENCOUNTER — Ambulatory Visit: Payer: Self-pay

## 2019-08-20 NOTE — Lactation Note (Signed)
This note was copied from a baby's chart. Lactation Consultation Note  Patient Name: Girl Yosselin Zoeller UUVOZ'D Date: 08/20/2019    [facility] Follow Up Visit:  Attempted to visit with mother, however, she was not present at this time.  RN can contact LC as needed.    Lindbergh Winkles R Kiril Hippe 08/20/2019, 11:52 AM

## 2019-08-28 ENCOUNTER — Other Ambulatory Visit: Payer: Self-pay

## 2019-08-28 ENCOUNTER — Ambulatory Visit (INDEPENDENT_AMBULATORY_CARE_PROVIDER_SITE_OTHER): Payer: Medicaid Other | Admitting: General Practice

## 2019-08-28 VITALS — BP 127/75 | HR 78 | Ht 66.0 in | Wt 194.0 lb

## 2019-08-28 DIAGNOSIS — Z5189 Encounter for other specified aftercare: Secondary | ICD-10-CM

## 2019-08-28 NOTE — Progress Notes (Signed)
Patient presents to office today for incision check following primary c-section on 1/15. Patient reports doing well since being home. Patient denies dizziness, headaches or blurry vision. Incision is clean, dry & intact- appears to be healing well. Reviewed wound care and signs & symptoms of infection with patient. Patient will follow up at Pp visit on 2/22.   Chase Caller RN BSN 08/28/19

## 2019-08-28 NOTE — Progress Notes (Signed)
Patient seen and assessed by nursing staff during this encounter. I have reviewed the chart and agree with the documentation and plan.  Thressa Sheller DNP, CNM  08/28/19  4:23 PM

## 2019-09-18 ENCOUNTER — Ambulatory Visit (INDEPENDENT_AMBULATORY_CARE_PROVIDER_SITE_OTHER): Payer: Medicaid Other | Admitting: Advanced Practice Midwife

## 2019-09-18 ENCOUNTER — Other Ambulatory Visit: Payer: Self-pay

## 2019-09-18 ENCOUNTER — Encounter: Payer: Self-pay | Admitting: Advanced Practice Midwife

## 2019-09-18 DIAGNOSIS — Z1389 Encounter for screening for other disorder: Secondary | ICD-10-CM

## 2019-09-18 NOTE — Progress Notes (Signed)
Appointment Date: 09/18/2019  OBGYN Clinic: [facility]   Chief Complaint:  Postpartum Visit  History of Present Illness: Wendy Vincent is a 44 y.o. Hispanic [phone] (Patient's last menstrual period was 12/02/2018.), seen for the above chief complaint. Her past medical history is significant for GDM, Smoker, IUGR   She is s/p primary cesarean section and tubal ligation was performed on 08/11/2019 at 36 weeks; she was discharged to home on PPD#3. Pregnancy complicated by GDM, IUGR. Baby is still in NICU.  Complains of None  Vaginal bleeding or discharge: Patient states that she had some bleeding for about 2 weeks. She states that all the bleeding stopped, but this week bleeding has resumed.  Mode of feeding infant: Bottle Intercourse: No  Contraception: bilateral tubal ligation PP depression s/s: No .  Any bowel or bladder issues: No  Pap smear: no abnormalities (date: at the HD in early pregnancy)  Review of Systems: Her 12 point review of systems is negative or as noted in the History of Present Illness.  Patient Active Problem List   Diagnosis Date Noted  . Labor and delivery, indication for care 08/11/2019  . Gestational diabetes 08/08/2019  . Unwanted fertility 06/14/2019  . Current smoker 06/12/2019  . History of preterm delivery, currently pregnant 06/12/2019  . History of gestational diabetes in prior pregnancy, currently pregnant 06/12/2019  . AMA (advanced maternal age) multigravida 35+ 06/12/2019  . Supervision of high-risk pregnancy 06/12/2019  . Known fetal anomaly, antepartum 06/12/2019  . Small for gestational age 66/16/2020  . IUGR (intrauterine growth restriction) affecting care of mother, third trimester, not applicable or unspecified fetus 06/12/2019  . 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 Current Outpatient Medications  Medication Sig Dispense Refill  . acetaminophen  (TYLENOL) 325 MG tablet Take 2 tablets (650 mg total) by mouth every 6 (six) hours as needed for mild pain (temperature > 101.5.). 30 tablet 0  . ibuprofen (ADVIL) 800 MG tablet Take 1 tablet (800 mg total) by mouth every 8 (eight) hours. 30 tablet 0  . oxyCODONE (OXY IR/ROXICODONE) 5 MG immediate release tablet Take 1-2 tablets (5-10 mg total) by mouth every 4 (four) hours as needed for moderate pain. (Patient not taking: Reported on 09/18/2019) 18 tablet 0  . Prenatal Vit-Fe Fumarate-FA (PRENATAL MULTIVITAMIN) TABS tablet Take 1 tablet by mouth daily at 12 noon.    . senna-docusate (SENOKOT-S) 8.6-50 MG tablet Take 2 tablets by mouth daily. (Patient not taking: Reported on 09/18/2019) 30 tablet 0   No current facility-administered medications for this visit.    Allergies Patient has no known allergies.  Physical Exam:  Physical Exam  Constitutional: She is well-developed, well-nourished, and in no distress. No distress.  HENT:  Head: Normocephalic.  Cardiovascular: Normal rate.  Pulmonary/Chest: Effort normal.  Abdominal: Soft. There is no abdominal tenderness. There is no rebound.  Neurological: She is alert.  Skin: Skin is warm and dry.  Psychiatric: Affect normal.  Nursing note and vitals reviewed.    PP Depression Screening:   Edinburgh Postnatal Depression Scale - 09/18/19 0904      Edinburgh Postnatal Depression Scale:  In the Past 7 Days   I have been able to laugh and see the funny side of things.  3    I have looked forward with enjoyment to things.  3    I have blamed myself unnecessarily when things went wrong.  0    I  have been anxious or worried for no good reason.  0    I have felt scared or panicky for no good reason.  0    Things have been getting on top of me.  0    I have been so unhappy that I have had difficulty sleeping.  0    I have felt sad or miserable.  0    I have been so unhappy that I have been crying.  0    The thought of harming myself has occurred  to me.  0    Edinburgh Postnatal Depression Scale Total  6       Assessment:Patient is a 44 y.o. [id number] who is 6 weeks postpartum from a primary cesarean section.  She is doing well.   Plan:  1. Postpartum care and examination - routine care - Period has returned - Patient has had BTL   RTC 1 year   Marcille Buffy DNP, CNM  09/18/19  9:32 AM  [facility] [place]

## 2019-09-19 ENCOUNTER — Ambulatory Visit: Payer: Self-pay

## 2019-09-19 NOTE — Lactation Note (Addendum)
This note was copied from a baby's chart. Lactation Consultation Note  Patient Name: Girl Anu Stagner VOPFY'T Date: 09/19/2019 Reason for consult: Follow-up assessment;Late-preterm 34-36.6wks;Infant weight loss,NICU P6, 5 week female LPTI, IUGR in NICU. LC changed one void and one stool ( yellow and seedy) small while in room quarter size stool.  Mom asked  for latch assistance with infant for 8 pm feeding, this  will be the infant's first attempt at breastfeeding. Per mom, infant was tube/ gavage feed prior to [facility] entering the room. Infant licked the breast and  only held breast in mouth,  but did not suckle at this time. LC discussed with mom that this is normal infant behavior for LPTI.   Per mom, she did breastfeed her other 5 children for 3 months. Mom has been using the hand pump 3 times per day and  only seeing small amounts of breast milk  (low milk supply) that she has been taking to NICU. LC discussed supply and demand with mom discussed ways in establishing milk supply : more frequent pumping, using a DEBP instead of hand pump and hand express after using the DEBP.  Mom is  agreeable to [facility] suggestions she will  start pumping 8 times per day instead of 3 times per day. LC explained mom who pump during the night have high supply mom plans pump twice during the night as well.  Mom currently has not applied for [facility] but plans to in obtaining  a [facility] loaner DEBP. Mom will start bringing the DEBP kit parts  that [facility] has given her tonight, to start  pumping while  in NICU after she latches infant at breast to help establish her milk supply. LC help mom, demonstrated how to convert her cylinder hand pump into a DEBP so that she can pump both breast at home, until she obtains the [place].  Mom has upcoming latch appointment at 10:45 am with [facility] 2/24/ 21 for latch assistance with infant. Mom's current goals: 1. Mom will pump more frequently 8 times in 24 hours, including two times at  night. 2. Mom will hand express after using the DEBP. 3. Mom will bring DEBP kit parts to hospital to use the DEBP in NICU after latching infant at breast. 4. Mom will apply to the [facility] program to obtain the [place]. 74. Mom will continue to work towards latching infant at breast with [facility] services.   Maternal Data    Feeding Feeding Type: Breast Fed  LATCH Score Latch: Too sleepy or reluctant, no latch achieved, no sucking elicited.  Audible Swallowing: None  Type of Nipple: Everted at rest and after stimulation  Comfort (Breast/Nipple): Soft / non-tender  Hold (Positioning): Assistance needed to correctly position infant at breast and maintain latch.  LATCH Score: 5  Interventions Interventions: Assisted with latch;Adjust position;Support pillows;Skin to skin;Breast massage;Position options;DEBP  Lactation Tools Discussed/Used Tools: Pump Flange Size: 27 Breast pump type: Manual;Double-Electric Breast Pump   Consult Status Consult Status: Follow-up Date: 09/20/19 Follow-up type: In-patient    Vicente Serene 09/19/2019, 8:36 PM

## 2019-09-20 ENCOUNTER — Ambulatory Visit: Payer: Self-pay

## 2019-09-20 NOTE — Lactation Note (Signed)
This note was copied from a baby's chart. Lactation Consultation Note  Patient Name: Wendy Vincent WBEQU'H Date: 09/20/2019 Reason for consult: Follow-up assessment;Late-preterm 34-36.6wks;Other (Comment);Mother's request(PMA - 41 5/7 days) Baby is 38 weeks old  11-7 LC reported to this [place] mom did not obtain a DEBP from [facility] in the 1st 2  Weeks of breast feeding and has been using a hand pump. 11-7 LC recommended and encouraged mom to pump when visiting the baby in NICU and provided the DEBP kit to mom.  Mom said [facility] contacted her in the 1st 2 weeks and she did not follow through to get the DEBP. This LC provided the [facility] breast feeding hot line for mom to call for a DEBP and LC will sent a fax DEBP referral today to [place] , mom aware.  Mom mentioned she is only able to pump off 10 -15 ml at a time and she has only been pumping 3-4 times a day.  LC reviewed supply and demand/ importance of thinking of a pumping as a feeding to tell her brain she needs milk. Also recommended when visiting the baby power pump by pumping both breast for 15-20 mins . Break for 10 mins , then 20 mins on, save the milk for the baby. Mom mentioned she is aware of NICU storage guidelines.  LC also explained to mom since the baby is 32 weeks old -  pumping 8-10 times a day  Was not consistent with a DEBP , milk supply is way behind and if she commits to consistent pumping her volume may or not be the volume she would have established in the 1st 2 weeks .  Baby awake , and due to feed , LC placed baby in a cross cradle , expressed several drops of milk and the baby was able to open wide enough to latch and sustain depth for 8 mins , noted to be sluggish at times with the feeding.   Maternal Data    Feeding Feeding Type: Breast Fed  LATCH Score Latch: Repeated attempts needed to sustain latch, nipple held in mouth throughout feeding, stimulation needed to elicit sucking reflex.  Audible Swallowing: A few with  stimulation  Type of Nipple: Everted at rest and after stimulation  Comfort (Breast/Nipple): Soft / non-tender  Hold (Positioning): Assistance needed to correctly position infant at breast and maintain latch.  LATCH Score: 7  Interventions Interventions: Breast feeding basics reviewed;Assisted with latch;Skin to skin;Breast massage;Hand express;Breast compression;Adjust position;Support pillows;Position options  Lactation Tools Discussed/Used Tools: Pump Flange Size: 27 Breast pump type: Double-Electric Breast Pump;Manual WIC Program: (per mom never followed up with WIC for a DEBP)   Consult Status Consult Status: PRN Date: (baby in NICU) Follow-up type: In-patient    [place] 09/20/2019, 11:23 AM

## 2019-09-21 ENCOUNTER — Other Ambulatory Visit: Payer: Self-pay | Admitting: *Deleted

## 2019-09-21 DIAGNOSIS — Z8632 Personal history of gestational diabetes: Secondary | ICD-10-CM

## 2019-09-25 ENCOUNTER — Other Ambulatory Visit: Payer: Medicaid Other

## 2019-10-19 ENCOUNTER — Encounter: Payer: Self-pay | Admitting: *Deleted

## 2019-12-23 IMAGING — US US MFM OB FOLLOW-UP
1 of 2 series · 13 of 28 positions shown · non-contrast
Comparison: none

[Series 1: us mfm ob follow-up · 13 of 27 slices shown]
[im 2/27]
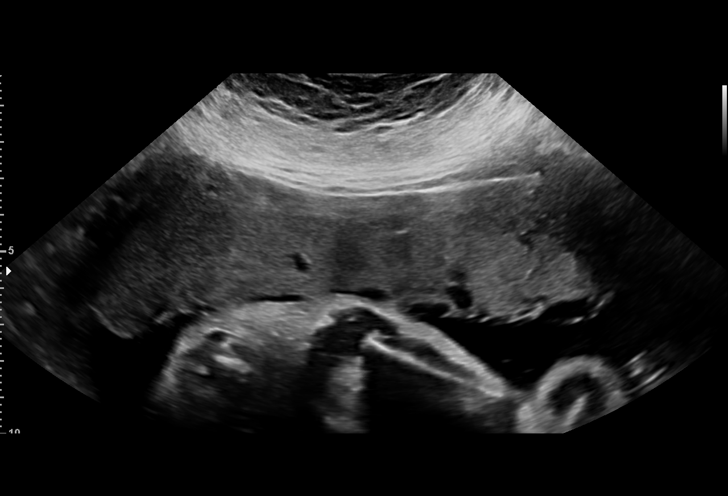
[im 4/27]
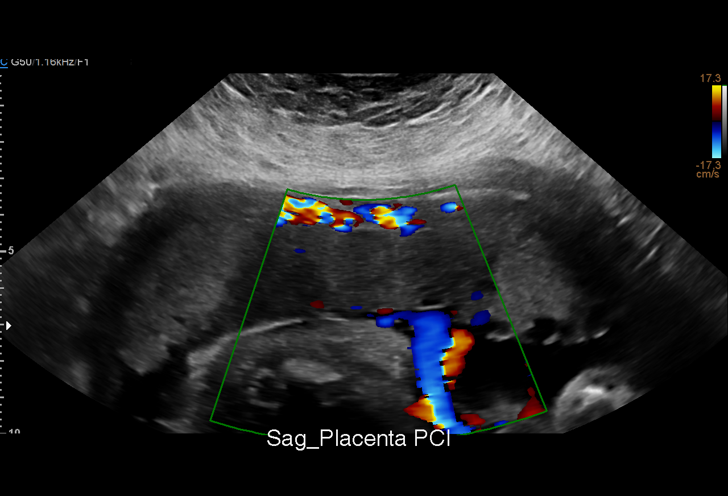
[im 6/27]
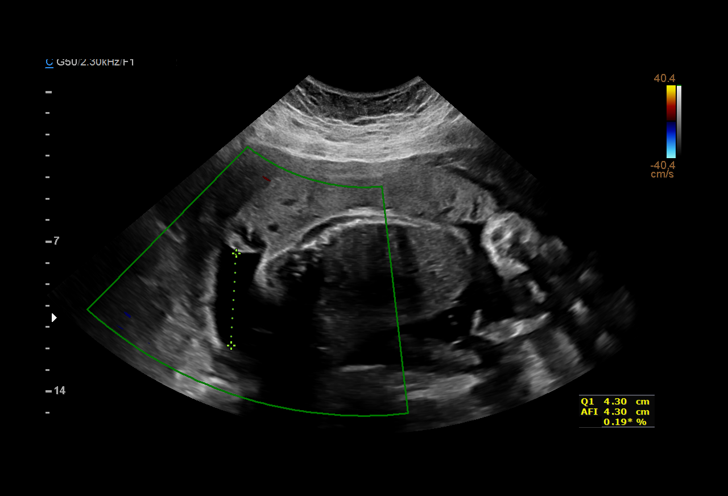
[im 8/27]
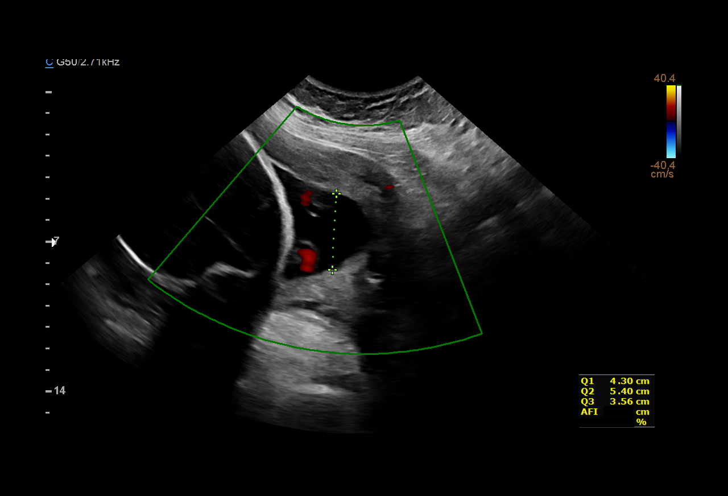
[im 10/27]
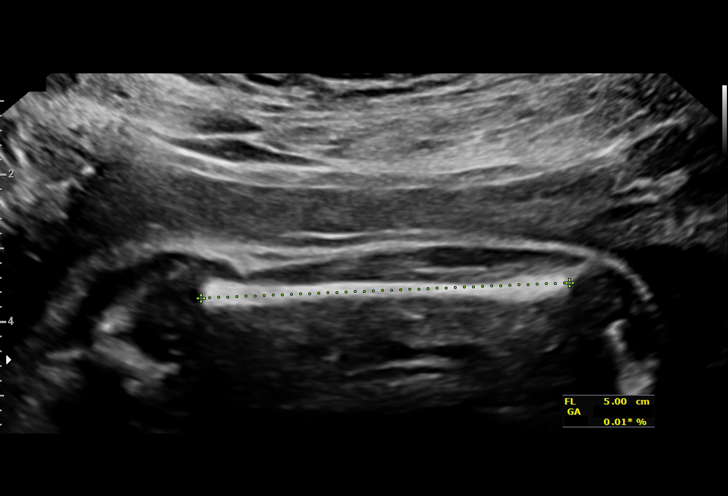
[im 12/27]
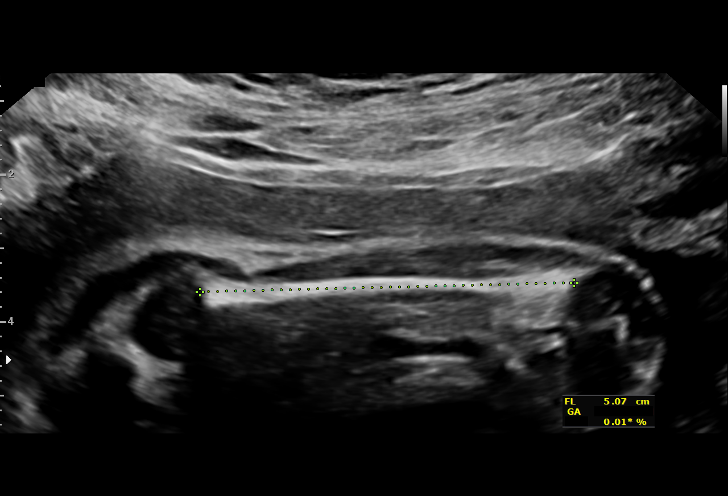
[im 15/27]
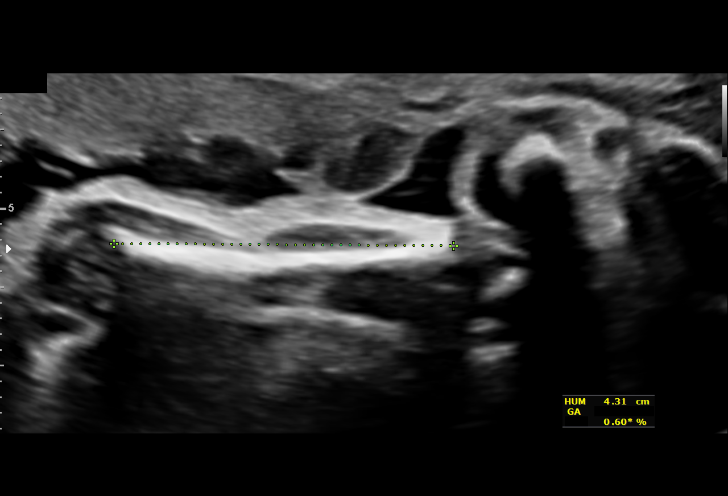
[im 17/27]
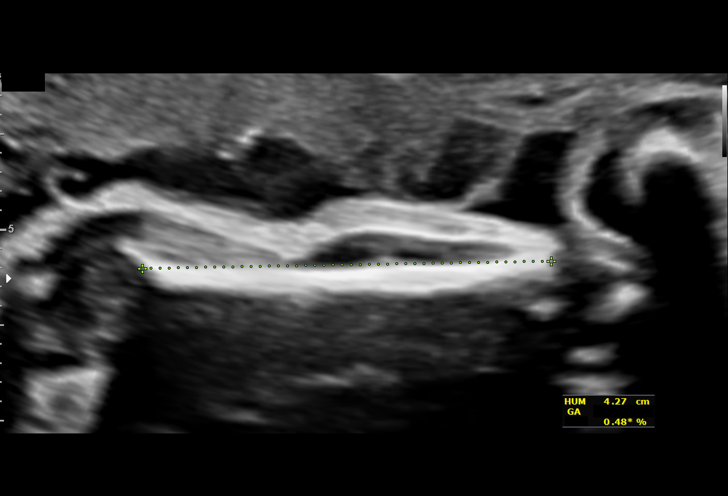
[im 19/27]
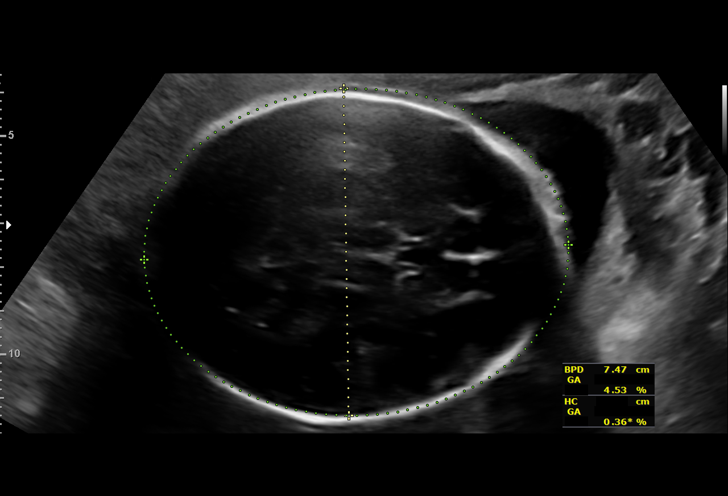
[im 21/27]
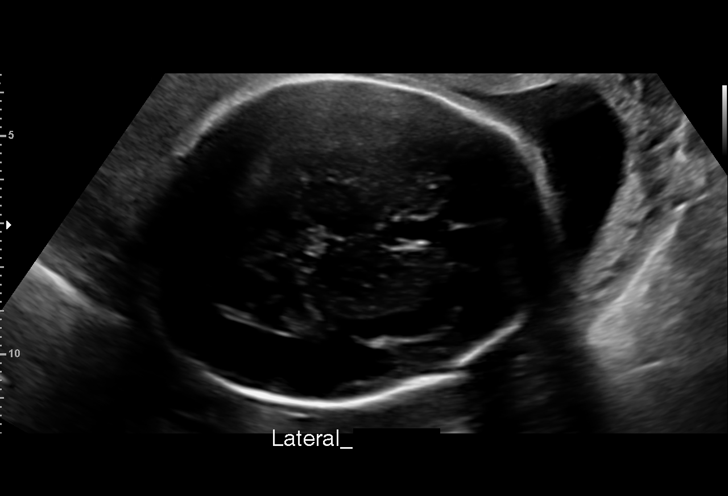
[im 23/27]
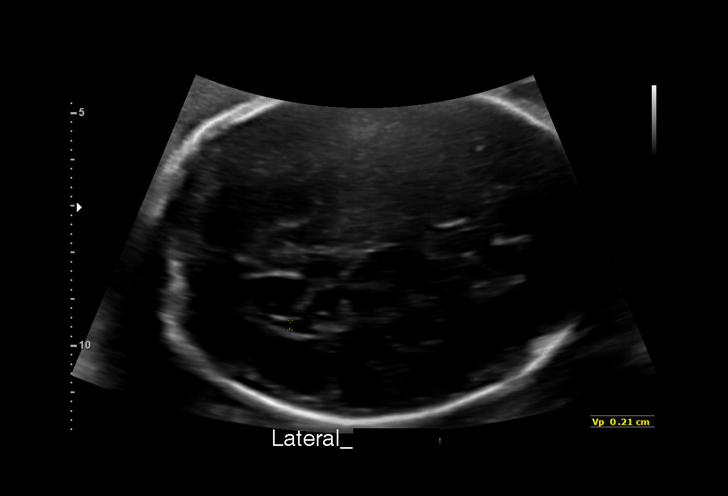
[im 25/27]
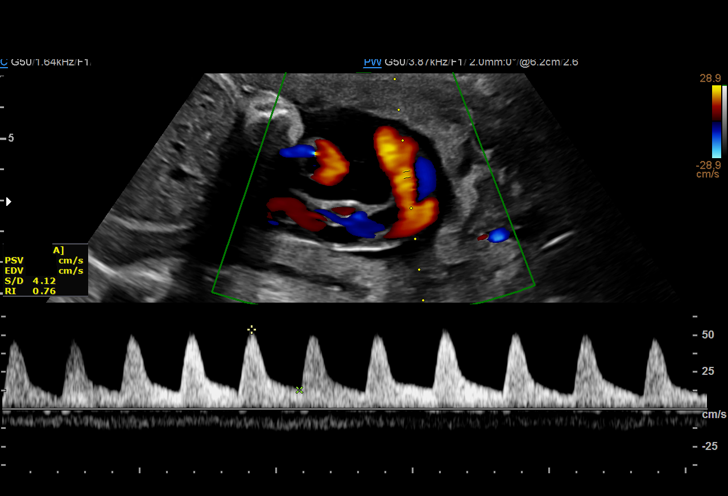
[im 27/27]
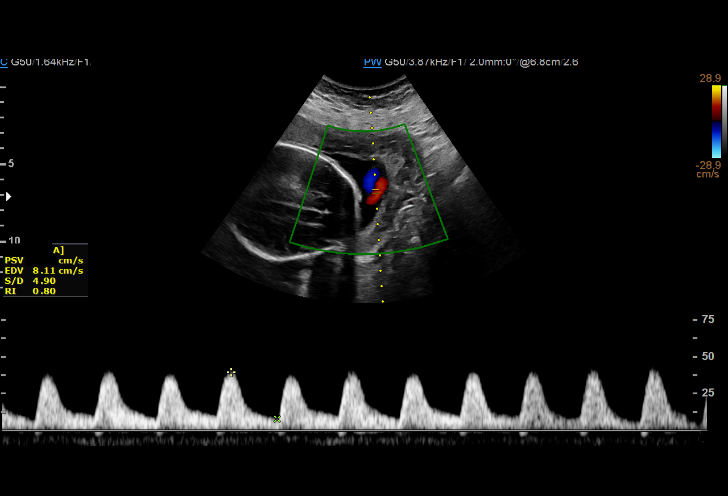

[13 of 28 positions shown; findings below may reference images not displayed]

----------------------------------------------------------------------

 ----------------------------------------------------------------------
Indications

  Maternal care for known or suspected poor
  fetal growth, third trimester, not applicable or
  unspecified IUGR
  Advanced maternal age multigravida 35+,
  third trimester
  Previous pregnancy complicated by
  chromosomal abnormality, antepartum (child
  with premordial dwarfism)
  Poor obstetric history: Previous preterm
  delivery, antepartum
  Smoking complicating pregnancy, third
  trimester
  31 weeks gestation of pregnancy
 ----------------------------------------------------------------------
Vital Signs

                                                Height:        5'5"
Fetal Evaluation

 Num Of Fetuses:         1
 Fetal Heart Rate(bpm):  131
 Cardiac Activity:       Observed
 Presentation:           Breech
 Placenta:               Anterior
 P. Cord Insertion:      Previously Visualized

 Amniotic Fluid
 AFI FV:      Within normal limits

 AFI Sum(cm)     %Tile       Largest Pocket(cm)
 18.18           68

 RUQ(cm)       RLQ(cm)       LUQ(cm)        LLQ(cm)

Biometry

 BPD:        74  mm     G. Age:  29w 5d          3  %    CI:         74.8   %    70 - 86
                                                         FL/HC:      18.5   %    19.1 -
 HC:      271.5  mm     G. Age:  29w 4d        < 1  %    HC/AC:      1.30        0.96 -
 AC:      208.3  mm     G. Age:  25w 3d        < 1  %    FL/BPD:     68.0   %    71 - 87
 FL:       50.3  mm     G. Age:  27w 0d        < 1  %    FL/AC:      24.1   %    20 - 24
 HUM:      42.8  mm     G. Age:  25w 4d        < 5  %
 LV:        2.1  mm

 Est. FW:     958  gm      2 lb 2 oz    < 1  %
OB History

 Gravidity:    8         Term:   4        Prem:   1        SAB:   2
 Living:       5
Gestational Age

 LMP:           31w 5d        Date:  12/02/18                 EDD:   09/08/19
 U/S Today:     28w 0d                                        EDD:   10/04/19
 Best:          31w 5d     Det. By:  LMP  (12/02/18)          EDD:   09/08/19
Anatomy

 Cranium:               Appears normal         LVOT:                   Previously seen
 Cavum:                 Appears normal         Aortic Arch:            Previously seen
 Ventricles:            Appears normal         Ductal Arch:            Previously seen
 Choroid Plexus:        Previously seen        Diaphragm:              Appears normal
 Cerebellum:            Previously seen        Stomach:                Appears normal, left
                                                                       sided
 Posterior Fossa:       Previously seen        Abdomen:                Previously seen
 Nuchal Fold:           Not applicable (>20    Abdominal Wall:         Previously seen
                        wks GA)
 Face:                  Orbits and profile     Cord Vessels:           Previously seen
                        previously seen
 Lips:                  Previously seen        Kidneys:                Previously seen
 Palate:                Previously seen        Bladder:                Previously seen
 Thoracic:              Appears normal         Spine:                  Previously seen
 Heart:                 Previously seen        Upper Extremities:      Previously seen
 RVOT:                  Previously seen        Lower Extremities:      Previously seen
Doppler - Fetal Vessels

 Umbilical Artery
  S/D     %tile                                            ADFV    RDFV
  4.6    > 97.5                                               No      No

Impression

 Early-onset fetal growth restriction. One of her children has
 primordial dwarfism. Patient had opted not to have
 amniocentesis for fetal karyotype or genetic conditions.
 Fetal echocardiography is reported as normal.

 On today's ultrasound, amniotic fluid is normal and good fetal
 activity is seen.  The estimated fetal weight is at less than the
 1st percentile.  Interval weight gain is 388 g (suboptimal).
 Head circumference measurement is between -2 and -1 SD.
 Abdominal circumference measurement is at less than the
 1st percentile.  Umbilical artery Doppler showed increased
 S/D ratio.  NST is reactive for this gestational age.
 I discussed the findings and informed the patient of the
 limitations of antenatal testing to detect fetal compromise.
 Severe growth restriction is associated with increased
 perinatal mortality and morbidity.
Recommendations

 -Weekly BPP, UA Doppler, NST till delivery.
                 Dy Le Sdr, Awa Kourotoum

## 2020-01-25 DIAGNOSIS — Z419 Encounter for procedure for purposes other than remedying health state, unspecified: Secondary | ICD-10-CM | POA: Diagnosis not present

## 2020-02-25 DIAGNOSIS — Z419 Encounter for procedure for purposes other than remedying health state, unspecified: Secondary | ICD-10-CM | POA: Diagnosis not present

## 2020-03-27 DIAGNOSIS — Z419 Encounter for procedure for purposes other than remedying health state, unspecified: Secondary | ICD-10-CM | POA: Diagnosis not present

## 2020-04-26 DIAGNOSIS — Z419 Encounter for procedure for purposes other than remedying health state, unspecified: Secondary | ICD-10-CM | POA: Diagnosis not present

## 2020-05-27 DIAGNOSIS — Z419 Encounter for procedure for purposes other than remedying health state, unspecified: Secondary | ICD-10-CM | POA: Diagnosis not present

## 2020-06-26 DIAGNOSIS — Z419 Encounter for procedure for purposes other than remedying health state, unspecified: Secondary | ICD-10-CM | POA: Diagnosis not present

## 2020-07-01 ENCOUNTER — Encounter: Payer: Self-pay | Admitting: General Practice

## 2020-07-27 DIAGNOSIS — Z419 Encounter for procedure for purposes other than remedying health state, unspecified: Secondary | ICD-10-CM | POA: Diagnosis not present

## 2020-08-27 DIAGNOSIS — Z419 Encounter for procedure for purposes other than remedying health state, unspecified: Secondary | ICD-10-CM | POA: Diagnosis not present

## 2020-09-24 DIAGNOSIS — Z419 Encounter for procedure for purposes other than remedying health state, unspecified: Secondary | ICD-10-CM | POA: Diagnosis not present

## 2020-10-25 DIAGNOSIS — Z419 Encounter for procedure for purposes other than remedying health state, unspecified: Secondary | ICD-10-CM | POA: Diagnosis not present

## 2020-11-24 DIAGNOSIS — Z419 Encounter for procedure for purposes other than remedying health state, unspecified: Secondary | ICD-10-CM | POA: Diagnosis not present

## 2020-12-25 DIAGNOSIS — Z419 Encounter for procedure for purposes other than remedying health state, unspecified: Secondary | ICD-10-CM | POA: Diagnosis not present

## 2021-01-24 DIAGNOSIS — Z419 Encounter for procedure for purposes other than remedying health state, unspecified: Secondary | ICD-10-CM | POA: Diagnosis not present

## 2021-02-24 DIAGNOSIS — Z419 Encounter for procedure for purposes other than remedying health state, unspecified: Secondary | ICD-10-CM | POA: Diagnosis not present

## 2021-03-27 DIAGNOSIS — Z419 Encounter for procedure for purposes other than remedying health state, unspecified: Secondary | ICD-10-CM | POA: Diagnosis not present

## 2021-04-26 DIAGNOSIS — Z419 Encounter for procedure for purposes other than remedying health state, unspecified: Secondary | ICD-10-CM | POA: Diagnosis not present

## 2021-05-27 DIAGNOSIS — Z419 Encounter for procedure for purposes other than remedying health state, unspecified: Secondary | ICD-10-CM | POA: Diagnosis not present

## 2021-06-17 ENCOUNTER — Other Ambulatory Visit: Payer: Self-pay

## 2021-06-17 ENCOUNTER — Encounter (HOSPITAL_COMMUNITY): Payer: Self-pay | Admitting: Emergency Medicine

## 2021-06-17 ENCOUNTER — Ambulatory Visit (HOSPITAL_COMMUNITY)
Admission: EM | Admit: 2021-06-17 | Discharge: 2021-06-17 | Disposition: A | Discharge status: Home / Self Care | Payer: Medicaid Other | Attending: Emergency Medicine | Admitting: Emergency Medicine

## 2021-06-17 DIAGNOSIS — R509 Fever, unspecified: Secondary | ICD-10-CM | POA: Diagnosis not present

## 2021-06-17 DIAGNOSIS — N3000 Acute cystitis without hematuria: Secondary | ICD-10-CM | POA: Diagnosis not present

## 2021-06-17 LAB — POC INFLUENZA A AND B ANTIGEN (URGENT CARE ONLY)
INFLUENZA A ANTIGEN, POC: NEGATIVE
INFLUENZA B ANTIGEN, POC: NEGATIVE

## 2021-06-17 LAB — POCT URINALYSIS DIPSTICK, ED / UC
Bilirubin Urine: NEGATIVE
Glucose, UA: 100 mg/dL — AB
Ketones, ur: NEGATIVE mg/dL
Nitrite: NEGATIVE
Protein, ur: 30 mg/dL — AB
Specific Gravity, Urine: 1.015 (ref 1.005–1.030)
Urobilinogen, UA: 1 mg/dL (ref 0.0–1.0)
pH: 8.5 — ABNORMAL HIGH (ref 5.0–8.0)

## 2021-06-17 MED ORDER — NITROFURANTOIN MONOHYD MACRO 100 MG PO CAPS: 100.0000 mg | ORAL_CAPSULE | Freq: Two times a day (BID) | ORAL | 0 refills | Status: AC

## 2021-06-17 NOTE — ED Triage Notes (Signed)
Pt having intermittent mid abd pains since Thursday. Dysuria since Thursday. Also having body aches and fatigue since the weekend. Denies n/v or bowel problems.

## 2021-06-17 NOTE — ED Provider Notes (Signed)
[facility]    CSN: [id number] Arrival date & time: 06/17/21  0818      History   Chief Complaint Chief Complaint  Patient presents with   Abdominal Pain   Dysuria    HPI Wendy Vincent is a 45 y.o. female.   Patient presents with increased fatigue, increased gas production, intermittent abdominal pain described as cramping, urinary frequency, urinary urgency, dysuria for 4 days. Poor appetite tolerating fluids.  No known sick contacts.  Has not attempted treatment of symptoms.  No pertinent medical history.  Denies nausea, vomiting, diarrhea, constipation, vaginal discharge, vaginal itching or irritation, odor, flank pain.  Sexually active, 1 partner, no condom use, endorses monogamous relationship.  No known exposure.  Currently on menses.  Past Medical History:  Diagnosis Date   Gestational diabetes    Preterm labor     Patient Active Problem List   Diagnosis Date Noted   Labor and delivery, indication for care 08/11/2019   Gestational diabetes 08/08/2019   Unwanted fertility 06/14/2019   Current smoker 06/12/2019   History of preterm delivery, currently pregnant 06/12/2019   History of gestational diabetes in prior pregnancy, currently pregnant 06/12/2019   AMA (advanced maternal age) multigravida 35+ 06/12/2019   Supervision of high-risk pregnancy 06/12/2019   Known fetal anomaly, antepartum 06/12/2019   Small for gestational age 74/16/2020   IUGR (intrauterine growth restriction) affecting care of mother, third trimester, not applicable or unspecified fetus 06/12/2019   Previous child with anomaly, antepartum 06/12/2019    Past Surgical History:  Procedure Laterality Date   CESAREAN SECTION WITH BILATERAL TUBAL LIGATION N/A 08/11/2019   Procedure: CESAREAN SECTION WITH BILATERAL TUBAL LIGATION;  Surgeon: Chancy Milroy, MD;  Location: [facility];  Service: Obstetrics;  Laterality: N/A;   NO PAST SURGERIES      OB History     Gravida  8   Para   6   Term  4   Preterm  2   AB  2   Living  6      SAB  2   IAB      Ectopic      Multiple  0   Live Births  6            Home Medications    Prior to Admission medications   Medication Sig Start Date End Date Taking? Authorizing Provider  acetaminophen (TYLENOL) 325 MG tablet Take 2 tablets (650 mg total) by mouth every 6 (six) hours as needed for mild pain (temperature > 101.5.). 08/14/19   Fair, Marin Shutter, MD  ibuprofen (ADVIL) 800 MG tablet Take 1 tablet (800 mg total) by mouth every 8 (eight) hours. 08/14/19   Fair, Marin Shutter, MD  oxyCODONE (OXY IR/ROXICODONE) 5 MG immediate release tablet Take 1-2 tablets (5-10 mg total) by mouth every 4 (four) hours as needed for moderate pain. Patient not taking: Reported on 09/18/2019 08/14/19   Chauncey Mann, MD  Prenatal Vit-Fe Fumarate-FA (PRENATAL MULTIVITAMIN) TABS tablet Take 1 tablet by mouth daily at 12 noon.    [provider]  senna-docusate (SENOKOT-S) 8.6-50 MG tablet Take 2 tablets by mouth daily. Patient not taking: Reported on 09/18/2019 08/15/19   Chauncey Mann, MD    Family History Family History  Problem Relation Age of Onset   Heart disease Mother     Social History Social History   Tobacco Use   Smoking status: Every Day    Packs/day: 0.50  Types: Cigarettes   Smokeless tobacco: Never  Vaping Use   Vaping Use: Never used  Substance Use Topics   Alcohol use: No   Drug use: No     Allergies   Patient has no known allergies.   Review of Systems Review of Systems  Constitutional:  Positive for appetite change, chills and fever. Negative for activity change, diaphoresis, fatigue and unexpected weight change.  HENT: Negative.    Respiratory: Negative.    Cardiovascular: Negative.   Gastrointestinal:  Positive for abdominal pain. Negative for abdominal distention, anal bleeding, blood in stool, constipation, diarrhea, nausea and rectal pain.  Genitourinary:  Positive for dysuria,  frequency and urgency. Negative for decreased urine volume, difficulty urinating, dyspareunia, enuresis, flank pain, genital sores, hematuria, menstrual problem, pelvic pain, vaginal bleeding, vaginal discharge and vaginal pain.  Skin: Negative.   Neurological: Negative.     Physical Exam Triage Vital Signs ED Triage Vitals  Enc Vitals Group     BP 06/17/21 0852 103/74     Pulse Rate 06/17/21 0852 (!) 122     Resp 06/17/21 0852 17     Temp 06/17/21 0852 (!) 101.2 F (38.4 C)     Temp Source 06/17/21 0852 Oral     SpO2 06/17/21 0852 100 %     Weight --      Height --      Head Circumference --      Peak Flow --      Pain Score 06/17/21 0851 5     Pain Loc --      Pain Edu? --      Excl. in [place]? --    No data found.  Updated Vital Signs BP 103/74 (BP Location: Left Arm)   Pulse (!) 122   Temp (!) 101.2 F (38.4 C) (Oral)   Resp 17   LMP 06/12/2021 (Exact Date)   SpO2 100%   Visual Acuity Right Eye Distance:   Left Eye Distance:   Bilateral Distance:    Right Eye Near:   Left Eye Near:    Bilateral Near:     Physical Exam Constitutional:      Appearance: She is well-developed and normal weight.  Eyes:     Extraocular Movements: Extraocular movements intact.  Pulmonary:     Effort: Pulmonary effort is normal.  Abdominal:     General: Abdomen is flat. Bowel sounds are normal.     Palpations: Abdomen is soft.     Tenderness: There is abdominal tenderness in the suprapubic area. There is no right CVA tenderness or left CVA tenderness.  Skin:    General: Skin is warm and dry.  Neurological:     Mental Status: She is alert and oriented to person, place, and time. Mental status is at baseline.  Psychiatric:        Mood and Affect: Mood normal.        Behavior: Behavior normal.     UC Treatments / Results  Labs (all labs ordered are listed, but only abnormal results are displayed) Labs Reviewed  POCT URINALYSIS DIPSTICK, ED / UC - Abnormal; Notable for the  following components:      Result Value   Glucose, UA 100 (*)    Hgb urine dipstick MODERATE (*)    pH 8.5 (*)    Protein, ur 30 (*)    Leukocytes,Ua MODERATE (*)    All other components within normal limits  POC INFLUENZA A AND B ANTIGEN (URGENT CARE ONLY)  EKG   Radiology No results found.  Procedures Procedures (including critical care time)  Medications Ordered in UC Medications - No data to display  Initial Impression / Assessment and Plan / UC Course  I have reviewed the triage vital signs and the nursing notes.  Pertinent labs & imaging results that were available during my care of the patient were reviewed by me and considered in my medical decision making (see chart for details).  Acute cystitis  1.  Macrobid 100 mg twice daily for 5 days, will treat prophylactically based on symptomology, urinalysis showing Onyx Schirmer blood cells as well as findings consistent with menses, possible urinary infection versus vaginal infection, discussed with patient, STI screening pending, will treat per protocol, advised abstinence until labs results and all symptoms have resolved, treatment is complete as necessary, given strict precautions for worsening symptoms to follow-up with urgent care, flu test negative Final Clinical Impressions(s) / UC Diagnoses   Final diagnoses:  None   Discharge Instructions   None    ED Prescriptions   None    PDMP not reviewed this encounter.   Valinda Hoar, NP 06/17/21 1023

## 2021-06-17 NOTE — Discharge Instructions (Signed)
Based on your symptoms today you will be treated for a urinary infection, your urine sample has been sent to the lab to see if it grows bacteria  Take Macrobid twice a day for 5 days   You may take over-the-counter Tylenol or ibuprofen to help with fevers and discomfort  Please return to urgent care if your symptoms do not improve  Vaginal swab Labs pending 2-3 days, you will be contacted if positive for any sti and treatment will be sent to the pharmacy, you will have to return to the clinic if positive for gonorrhea to receive treatment   Please refrain from having sex until labs results, if positive please refrain from having sex until treatment complete and symptoms resolve   If positive for  Chlamydia  gonorrhea or trichomoniasis please notify partner or partners so they may tested as well  Moving forward, it is recommended you use some form of protection against the transmission of sti infections  such as condoms or dental dams with each sexual encounter

## 2021-06-18 ENCOUNTER — Telehealth (HOSPITAL_COMMUNITY): Payer: Self-pay | Admitting: Emergency Medicine

## 2021-06-18 LAB — CERVICOVAGINAL ANCILLARY ONLY
Bacterial Vaginitis (gardnerella): POSITIVE — AB
Candida Glabrata: NEGATIVE
Candida Vaginitis: NEGATIVE
Chlamydia: NEGATIVE
Comment: NEGATIVE
Comment: NEGATIVE
Comment: NEGATIVE
Comment: NEGATIVE
Comment: NEGATIVE
Comment: NORMAL
Neisseria Gonorrhea: NEGATIVE
Trichomonas: NEGATIVE

## 2021-06-18 MED ORDER — METRONIDAZOLE 500 MG PO TABS: 500.0000 mg | ORAL_TABLET | Freq: Two times a day (BID) | ORAL | 0 refills | Status: AC

## 2021-06-19 LAB — URINE CULTURE: Culture: >=100000 — AB

## 2021-06-26 DIAGNOSIS — Z419 Encounter for procedure for purposes other than remedying health state, unspecified: Secondary | ICD-10-CM | POA: Diagnosis not present

## 2021-07-27 DIAGNOSIS — Z419 Encounter for procedure for purposes other than remedying health state, unspecified: Secondary | ICD-10-CM | POA: Diagnosis not present

## 2021-08-27 DIAGNOSIS — Z419 Encounter for procedure for purposes other than remedying health state, unspecified: Secondary | ICD-10-CM | POA: Diagnosis not present

## 2021-09-24 DIAGNOSIS — Z419 Encounter for procedure for purposes other than remedying health state, unspecified: Secondary | ICD-10-CM | POA: Diagnosis not present

## 2021-10-25 DIAGNOSIS — Z419 Encounter for procedure for purposes other than remedying health state, unspecified: Secondary | ICD-10-CM | POA: Diagnosis not present

## 2021-11-24 DIAGNOSIS — Z419 Encounter for procedure for purposes other than remedying health state, unspecified: Secondary | ICD-10-CM | POA: Diagnosis not present

## 2021-12-25 DIAGNOSIS — Z419 Encounter for procedure for purposes other than remedying health state, unspecified: Secondary | ICD-10-CM | POA: Diagnosis not present

## 2022-01-24 DIAGNOSIS — Z419 Encounter for procedure for purposes other than remedying health state, unspecified: Secondary | ICD-10-CM | POA: Diagnosis not present

## 2022-02-24 DIAGNOSIS — Z419 Encounter for procedure for purposes other than remedying health state, unspecified: Secondary | ICD-10-CM | POA: Diagnosis not present

## 2022-03-14 ENCOUNTER — Ambulatory Visit (HOSPITAL_COMMUNITY)
Admission: EM | Admit: 2022-03-14 | Discharge: 2022-03-14 | Disposition: A | Discharge status: Home / Self Care | Payer: Medicaid Other | Attending: Internal Medicine | Admitting: Internal Medicine

## 2022-03-14 ENCOUNTER — Encounter (HOSPITAL_COMMUNITY): Payer: Self-pay

## 2022-03-14 DIAGNOSIS — N644 Mastodynia: Secondary | ICD-10-CM | POA: Diagnosis not present

## 2022-03-14 DIAGNOSIS — N61 Mastitis without abscess: Secondary | ICD-10-CM

## 2022-03-14 DIAGNOSIS — N6459 Other signs and symptoms in breast: Secondary | ICD-10-CM | POA: Diagnosis not present

## 2022-03-14 MED ORDER — IBUPROFEN 600 MG PO TABS: 600.0000 mg | ORAL_TABLET | Freq: Four times a day (QID) | ORAL | 0 refills | Status: AC | PRN

## 2022-03-14 MED ORDER — CEPHALEXIN 500 MG PO CAPS: 500.0000 mg | ORAL_CAPSULE | Freq: Three times a day (TID) | ORAL | 0 refills | Status: AC

## 2022-03-14 NOTE — ED Triage Notes (Signed)
Pt reports left breast pain x 4 days.

## 2022-03-14 NOTE — Discharge Instructions (Signed)
Your symptoms are likely related to a bacterial infection called mastitis but I would like for you to get a mammogram to rule out possible cyst to the breast.  I have initiated an internal referral to the breast center for you to schedule a mammogram.  The breast center should be reaching out to you soon.  You may also call the phone number to the breast center listed on your paperwork to schedule an appointment for soon as possible.   Take Keflex antibiotic 3 times daily for the next 7 days. Apply cabbage leaves to your left breast and inside of your bra to reduce inflammation and pain.  You may also take ibuprofen 600 mg every 6 hours as needed for pain and inflammation.

## 2022-03-14 NOTE — ED Provider Notes (Signed)
[facility]    CSN: [id number] Arrival date & time: 03/14/22  1002      History   Chief Complaint Chief Complaint  Patient presents with  . Breast Pain    HPI Wendy Vincent is a 46 y.o. female.   Patient presents to the urgent care    Past Medical History:  Diagnosis Date  . Gestational diabetes   . Preterm labor     Patient Active Problem List   Diagnosis Date Noted  . Labor and delivery, indication for care 08/11/2019  . Gestational diabetes 08/08/2019  . Unwanted fertility 06/14/2019  . Current smoker 06/12/2019  . History of preterm delivery, currently pregnant 06/12/2019  . History of gestational diabetes in prior pregnancy, currently pregnant 06/12/2019  . AMA (advanced maternal age) multigravida 35+ 06/12/2019  . Supervision of high-risk pregnancy 06/12/2019  . Known fetal anomaly, antepartum 06/12/2019  . Small for gestational age 28/16/2020  . IUGR (intrauterine growth restriction) affecting care of mother, third trimester, not applicable or unspecified fetus 06/12/2019  . Previous child with anomaly, antepartum 06/12/2019    Past Surgical History:  Procedure Laterality Date  . CESAREAN SECTION WITH BILATERAL TUBAL LIGATION N/A 08/11/2019   Procedure: CESAREAN SECTION WITH BILATERAL TUBAL LIGATION;  Surgeon: Hermina Staggers, MD;  Location: [facility];  Service: Obstetrics;  Laterality: N/A;  . NO PAST SURGERIES      OB History     Gravida  8   Para  6   Term  4   Preterm  2   AB  2   Living  6      SAB  2   IAB      Ectopic      Multiple  0   Live Births  6            Home Medications    Prior to Admission medications   Medication Sig Start Date End Date Taking? Authorizing Provider  cephALEXin (KEFLEX) 500 MG capsule Take 1 capsule (500 mg total) by mouth 3 (three) times daily for 7 days. 03/14/22 03/21/22 Yes Carlisle Beers, FNP  ibuprofen (ADVIL) 600 MG tablet Take 1 tablet (600 mg total) by mouth  every 6 (six) hours as needed. 03/14/22  Yes Carlisle Beers, FNP  acetaminophen (TYLENOL) 325 MG tablet Take 2 tablets (650 mg total) by mouth every 6 (six) hours as needed for mild pain (temperature > 101.5.). 08/14/19   Fair, Hoyle Sauer, MD  metroNIDAZOLE (FLAGYL) 500 MG tablet Take 1 tablet (500 mg total) by mouth 2 (two) times daily. 06/18/21   LampteyBritta Mccreedy, MD  nitrofurantoin, macrocrystal-monohydrate, (MACROBID) 100 MG capsule Take 1 capsule (100 mg total) by mouth 2 (two) times daily. 06/17/21   White, Elita Boone, NP  oxyCODONE (OXY IR/ROXICODONE) 5 MG immediate release tablet Take 1-2 tablets (5-10 mg total) by mouth every 4 (four) hours as needed for moderate pain. Patient not taking: Reported on 09/18/2019 08/14/19   Joselyn Arrow, MD  Prenatal Vit-Fe Fumarate-FA (PRENATAL MULTIVITAMIN) TABS tablet Take 1 tablet by mouth daily at 12 noon.    [provider]  senna-docusate (SENOKOT-S) 8.6-50 MG tablet Take 2 tablets by mouth daily. Patient not taking: Reported on 09/18/2019 08/15/19   Joselyn Arrow, MD    Family History Family History  Problem Relation Age of Onset  . Heart disease Mother     Social History Social History   Tobacco Use  . Smoking status: Every  Day    Packs/day: 0.50    Types: Cigarettes  . Smokeless tobacco: Never  Vaping Use  . Vaping Use: Never used  Substance Use Topics  . Alcohol use: No  . Drug use: No     Allergies   Patient has no known allergies.   Review of Systems Review of Systems   Physical Exam Triage Vital Signs ED Triage Vitals [03/14/22 1021]  Enc Vitals Group     BP (!) 140/73     Pulse Rate 79     Resp 16     Temp 98.8 F (37.1 C)     Temp Source Oral     SpO2 100 %     Weight      Height      Head Circumference      Peak Flow      Pain Score      Pain Loc      Pain Edu?      Excl. in [place]?    No data found.  Updated Vital Signs BP (!) 140/73 (BP Location: Left Arm)   Pulse 79   Temp 98.8 F  (37.1 C) (Oral)   Resp 16   LMP 02/16/2022   SpO2 100%   Visual Acuity Right Eye Distance:   Left Eye Distance:   Bilateral Distance:    Right Eye Near:   Left Eye Near:    Bilateral Near:     Physical Exam   UC Treatments / Results  Labs (all labs ordered are listed, but only abnormal results are displayed) Labs Reviewed - No data to display  EKG   Radiology No results found.  Procedures Procedures (including critical care time)  Medications Ordered in UC Medications - No data to display  Initial Impression / Assessment and Plan / UC Course  I have reviewed the triage vital signs and the nursing notes.  Pertinent labs & imaging results that were available during my care of the patient were reviewed by me and considered in my medical decision making (see chart for details).     *** Final Clinical Impressions(s) / UC Diagnoses   Final diagnoses:  Inversion of left nipple  Breast pain  Mastitis     Discharge Instructions      Your symptoms are likely related to a bacterial infection called mastitis but I would like for you to get a mammogram to rule out possible cyst to the breast.  I have initiated an internal referral to the breast center for you to schedule a mammogram.  The breast center should be reaching out to you soon.  You may also call the phone number to the breast center listed on your paperwork to schedule an appointment for soon as possible.   Take Keflex antibiotic 3 times daily for the next 7 days. Apply cabbage leaves to your left breast and inside of your bra to reduce inflammation and pain.  You may also take ibuprofen 600 mg every 6 hours as needed for pain and inflammation.    ED Prescriptions     Medication Sig Dispense Auth. Provider   ibuprofen (ADVIL) 600 MG tablet Take 1 tablet (600 mg total) by mouth every 6 (six) hours as needed. 30 tablet Reita May M, FNP   cephALEXin (KEFLEX) 500 MG capsule Take 1 capsule (500 mg  total) by mouth 3 (three) times daily for 7 days. 21 capsule Carlisle Beers, FNP      PDMP not reviewed this encounter.

## 2022-03-16 ENCOUNTER — Other Ambulatory Visit (HOSPITAL_COMMUNITY): Payer: Self-pay | Admitting: Internal Medicine

## 2022-03-16 DIAGNOSIS — N6459 Other signs and symptoms in breast: Secondary | ICD-10-CM

## 2022-03-16 DIAGNOSIS — N6320 Unspecified lump in the left breast, unspecified quadrant: Secondary | ICD-10-CM

## 2022-03-27 DIAGNOSIS — Z419 Encounter for procedure for purposes other than remedying health state, unspecified: Secondary | ICD-10-CM | POA: Diagnosis not present

## 2022-04-01 ENCOUNTER — Other Ambulatory Visit (HOSPITAL_COMMUNITY): Payer: Self-pay | Admitting: Internal Medicine

## 2022-04-01 ENCOUNTER — Ambulatory Visit
Admission: RE | Admit: 2022-04-01 | Discharge: 2022-04-01 | Disposition: A | Discharge status: Home / Self Care | Payer: Medicaid Other | Source: Ambulatory Visit | Attending: Internal Medicine | Admitting: Internal Medicine

## 2022-04-01 DIAGNOSIS — R922 Inconclusive mammogram: Secondary | ICD-10-CM | POA: Diagnosis not present

## 2022-04-01 DIAGNOSIS — N6323 Unspecified lump in the left breast, lower outer quadrant: Secondary | ICD-10-CM | POA: Diagnosis not present

## 2022-04-01 DIAGNOSIS — N6320 Unspecified lump in the left breast, unspecified quadrant: Secondary | ICD-10-CM

## 2022-04-01 DIAGNOSIS — N6459 Other signs and symptoms in breast: Secondary | ICD-10-CM

## 2022-04-01 DIAGNOSIS — N6324 Unspecified lump in the left breast, lower inner quadrant: Secondary | ICD-10-CM | POA: Diagnosis not present

## 2022-04-07 ENCOUNTER — Other Ambulatory Visit: Payer: Medicaid Other

## 2022-04-18 DIAGNOSIS — Z0000 Encounter for general adult medical examination without abnormal findings: Secondary | ICD-10-CM | POA: Diagnosis not present

## 2022-04-18 DIAGNOSIS — N911 Secondary amenorrhea: Secondary | ICD-10-CM | POA: Diagnosis not present

## 2022-04-18 DIAGNOSIS — R5383 Other fatigue: Secondary | ICD-10-CM | POA: Diagnosis not present

## 2022-04-18 DIAGNOSIS — Z1159 Encounter for screening for other viral diseases: Secondary | ICD-10-CM | POA: Diagnosis not present

## 2022-04-18 DIAGNOSIS — R300 Dysuria: Secondary | ICD-10-CM | POA: Diagnosis not present

## 2022-04-18 DIAGNOSIS — Z6832 Body mass index (BMI) 32.0-32.9, adult: Secondary | ICD-10-CM | POA: Diagnosis not present

## 2022-04-18 DIAGNOSIS — E559 Vitamin D deficiency, unspecified: Secondary | ICD-10-CM | POA: Diagnosis not present

## 2022-04-26 DIAGNOSIS — Z419 Encounter for procedure for purposes other than remedying health state, unspecified: Secondary | ICD-10-CM | POA: Diagnosis not present

## 2022-04-28 ENCOUNTER — Other Ambulatory Visit: Payer: Self-pay | Admitting: Internal Medicine

## 2022-04-28 DIAGNOSIS — N6320 Unspecified lump in the left breast, unspecified quadrant: Secondary | ICD-10-CM

## 2022-05-16 ENCOUNTER — Ambulatory Visit (HOSPITAL_COMMUNITY)
Admission: EM | Admit: 2022-05-16 | Discharge: 2022-05-16 | Disposition: A | Discharge status: Home / Self Care | Payer: Medicaid Other | Attending: Emergency Medicine | Admitting: Emergency Medicine

## 2022-05-16 ENCOUNTER — Encounter (HOSPITAL_COMMUNITY): Payer: Self-pay

## 2022-05-16 DIAGNOSIS — N6324 Unspecified lump in the left breast, lower inner quadrant: Secondary | ICD-10-CM | POA: Diagnosis not present

## 2022-05-16 MED ORDER — KETOROLAC TROMETHAMINE 30 MG/ML IJ SOLN
30.0000 mg | Freq: Once | INTRAMUSCULAR | Status: AC
  Administered 2022-05-16: 30 mg via INTRAMUSCULAR

## 2022-05-16 MED ORDER — KETOROLAC TROMETHAMINE 30 MG/ML IJ SOLN
INTRAMUSCULAR | Status: AC
  Filled 2022-05-16: qty 1

## 2022-05-16 MED ORDER — NAPROXEN 375 MG PO TABS: 375.0000 mg | ORAL_TABLET | Freq: Two times a day (BID) | ORAL | 0 refills | Status: AC

## 2022-05-16 NOTE — Discharge Instructions (Signed)
I have sent a referral to [facility] breast imaging.  It is very important that you follow-up with them.  Information for the Center has been attached to your paperwork.   Naproxen was sent to the pharmacy, you can take this medication 2 times daily with 12 hours in between each dose.  I recommend that you take this medication with food on your stomach because of potential GI side effects.

## 2022-05-16 NOTE — ED Triage Notes (Signed)
Pt c/o left breast discomfort and warm to touch.  Pt reports she feels a small lump on her breast. Pt reports was seen on 03/14/2022 for the same breast and referred to her pcp.  Pt reports her PCP never follow-up with scheduling her a breast biopsy.

## 2022-05-16 NOTE — ED Provider Notes (Signed)
[place]    CSN: [id number] Arrival date & time: 05/16/22  1003      History   Chief Complaint Chief Complaint  Patient presents with   Breast Pain    HPI Wendy Vincent is a 46 y.o. female.  Patient presents complaining of left sided breast pain x 2 months.  Patient was seen at this clinic on 03/14/2022 and was referred to the breast clinic for imaging.  Breast imaging that was performed found a suspicious intraductal mass in the lower subareolar breast at the 6-7 o'clock location, it was recommended that the patient have a needle biopsy of the area.  This patient has established care with [facility] as a PCP.  Patient states the PCP has sent a referral but the breast center states they have not received a referral.  Patient continues to have pain that is progressively worsening.  Patient was initially given Keflex and ibuprofen for management, patient states she had some relief of symptoms at the time but now the pain has worsened.  Patient denies any unintentional weight loss, fever, night sweats, or any family history of breast cancer.   HPI  Past Medical History:  Diagnosis Date   Gestational diabetes    Preterm labor     Patient Active Problem List   Diagnosis Date Noted   Labor and delivery, indication for care 08/11/2019   Gestational diabetes 08/08/2019   Unwanted fertility 06/14/2019   Current smoker 06/12/2019   History of preterm delivery, currently pregnant 06/12/2019   History of gestational diabetes in prior pregnancy, currently pregnant 06/12/2019   AMA (advanced maternal age) multigravida 35+ 06/12/2019   Supervision of high-risk pregnancy 06/12/2019   Known fetal anomaly, antepartum 06/12/2019   Small for gestational age 48/16/2020   IUGR (intrauterine growth restriction) affecting care of mother, third trimester, not applicable or unspecified fetus 06/12/2019   Previous child with anomaly, antepartum 06/12/2019    Past Surgical  History:  Procedure Laterality Date   CESAREAN SECTION WITH BILATERAL TUBAL LIGATION N/A 08/11/2019   Procedure: CESAREAN SECTION WITH BILATERAL TUBAL LIGATION;  Surgeon: Chancy Milroy, MD;  Location: [facility];  Service: Obstetrics;  Laterality: N/A;   NO PAST SURGERIES      OB History     Gravida  8   Para  6   Term  4   Preterm  2   AB  2   Living  6      SAB  2   IAB      Ectopic      Multiple  0   Live Births  6            Home Medications    Prior to Admission medications   Medication Sig Start Date End Date Taking? Authorizing Provider  naproxen (NAPROSYN) 375 MG tablet Take 1 tablet (375 mg total) by mouth 2 (two) times daily. 05/16/22  Yes Flossie Dibble, NP  acetaminophen (TYLENOL) 325 MG tablet Take 2 tablets (650 mg total) by mouth every 6 (six) hours as needed for mild pain (temperature > 101.5.). 08/14/19   Fair, Marin Shutter, MD  ibuprofen (ADVIL) 600 MG tablet Take 1 tablet (600 mg total) by mouth every 6 (six) hours as needed. 03/14/22   Talbot Grumbling, FNP  metroNIDAZOLE (FLAGYL) 500 MG tablet Take 1 tablet (500 mg total) by mouth 2 (two) times daily. 06/18/21   LampteyMyrene Galas, MD  nitrofurantoin, macrocrystal-monohydrate, (MACROBID) 100 MG capsule  Take 1 capsule (100 mg total) by mouth 2 (two) times daily. 06/17/21   White, Leitha Schuller, NP  oxyCODONE (OXY IR/ROXICODONE) 5 MG immediate release tablet Take 1-2 tablets (5-10 mg total) by mouth every 4 (four) hours as needed for moderate pain. Patient not taking: Reported on 09/18/2019 08/14/19   Chauncey Mann, MD  Prenatal Vit-Fe Fumarate-FA (PRENATAL MULTIVITAMIN) TABS tablet Take 1 tablet by mouth daily at 12 noon.    [provider]  senna-docusate (SENOKOT-S) 8.6-50 MG tablet Take 2 tablets by mouth daily. Patient not taking: Reported on 09/18/2019 08/15/19   Chauncey Mann, MD    Family History Family History  Problem Relation Age of Onset   Heart disease Mother      Social History Social History   Tobacco Use   Smoking status: Every Day    Packs/day: 0.50    Types: Cigarettes   Smokeless tobacco: Never  Vaping Use   Vaping Use: Never used  Substance Use Topics   Alcohol use: No   Drug use: No     Allergies   Patient has no known allergies.   Review of Systems Review of Systems  Constitutional:  Negative for activity change, appetite change, chills, fatigue, fever and unexpected weight change.  Skin:        Reports left-sided breast pain that is progressively worsening.  Patient also reports warmth to site.  Denies any drainage from nipple.      Physical Exam Triage Vital Signs ED Triage Vitals [05/16/22 1023]  Enc Vitals Group     BP (!) 148/91     Pulse Rate 82     Resp 18     Temp 98.7 F (37.1 C)     Temp Source Oral     SpO2 98 %     Weight      Height      Head Circumference      Peak Flow      Pain Score      Pain Loc      Pain Edu?      Excl. in [place]?    No data found.  Updated Vital Signs BP (!) 148/91 (BP Location: Left Arm)   Pulse 82   Temp 98.7 F (37.1 C) (Oral)   Resp 18   SpO2 98%       Physical Exam Vitals and nursing note reviewed.  Constitutional:      Appearance: Normal appearance.  Chest:  Breasts:    Right: Normal.     Left: Swelling, inverted nipple, mass, skin change and tenderness present. No bleeding or nipple discharge.       Comments: Palpable, hard nonmovable lesion located in the 6-7 o'clock region of left breast.  Patient endorses pain upon palpation of breasts.  Erythema present to site.  Neurological:     Mental Status: She is alert.      UC Treatments / Results  Labs (all labs ordered are listed, but only abnormal results are displayed) Labs Reviewed - No data to display  EKG   Radiology No results found.  Procedures Procedures (including critical care time)  Medications Ordered in UC Medications  ketorolac (TORADOL) 30 MG/ML injection 30 mg (30 mg  Intramuscular Given 05/16/22 1135)    Initial Impression / Assessment and Plan / UC Course  I have reviewed the triage vital signs and the nursing notes.  Pertinent labs & imaging results that were available during my care of the patient were  reviewed by me and considered in my medical decision making (see chart for details).     She was evaluated due to mass of left breast .  Urgent referral has been sent over to [facility] breast imaging.  Toradol was given in office due to continued pain and inflammation of site.  Naproxen was sent to pharmacy for discharge.  Patient was educated on the importance of follow-up with [facility] imaging center.  Due to the highly suspicious mass located by mammography/ultrasound. Patient was made aware that she should call Monday morning and the center opens.  Patient verbalized understanding of instructions.  Final Clinical Impressions(s) / UC Diagnoses   Final diagnoses:  Mass of lower inner quadrant of left breast     Discharge Instructions      I have sent a referral to [facility] breast imaging.  It is very important that you follow-up with them.  Information for the Center has been attached to your paperwork.   Naproxen was sent to the pharmacy, you can take this medication 2 times daily with 12 hours in between each dose.  I recommend that you take this medication with food on your stomach because of potential GI side effects.      ED Prescriptions     Medication Sig Dispense Auth. Provider   naproxen (NAPROSYN) 375 MG tablet Take 1 tablet (375 mg total) by mouth 2 (two) times daily. 30 tablet Flossie Dibble, NP      PDMP not reviewed this encounter.   Flossie Dibble, NP 05/16/22 1218

## 2022-05-18 DIAGNOSIS — R030 Elevated blood-pressure reading, without diagnosis of hypertension: Secondary | ICD-10-CM | POA: Diagnosis not present

## 2022-05-18 DIAGNOSIS — R7309 Other abnormal glucose: Secondary | ICD-10-CM | POA: Diagnosis not present

## 2022-05-18 DIAGNOSIS — E559 Vitamin D deficiency, unspecified: Secondary | ICD-10-CM | POA: Diagnosis not present

## 2022-05-18 DIAGNOSIS — N644 Mastodynia: Secondary | ICD-10-CM | POA: Diagnosis not present

## 2022-05-18 DIAGNOSIS — Z6832 Body mass index (BMI) 32.0-32.9, adult: Secondary | ICD-10-CM | POA: Diagnosis not present

## 2022-05-18 DIAGNOSIS — N63 Unspecified lump in unspecified breast: Secondary | ICD-10-CM | POA: Diagnosis not present

## 2022-05-27 DIAGNOSIS — Z419 Encounter for procedure for purposes other than remedying health state, unspecified: Secondary | ICD-10-CM | POA: Diagnosis not present

## 2022-06-01 DIAGNOSIS — N6002 Solitary cyst of left breast: Secondary | ICD-10-CM | POA: Diagnosis not present

## 2022-06-01 DIAGNOSIS — N6320 Unspecified lump in the left breast, unspecified quadrant: Secondary | ICD-10-CM | POA: Diagnosis not present

## 2022-06-08 DIAGNOSIS — N61 Mastitis without abscess: Secondary | ICD-10-CM | POA: Diagnosis not present

## 2022-06-08 DIAGNOSIS — Z6832 Body mass index (BMI) 32.0-32.9, adult: Secondary | ICD-10-CM | POA: Diagnosis not present

## 2022-06-10 ENCOUNTER — Emergency Department (HOSPITAL_COMMUNITY)
Admission: EM | Admit: 2022-06-10 | Discharge: 2022-06-10 | Disposition: A | Discharge status: Home / Self Care | Payer: Medicaid Other | Attending: Emergency Medicine | Admitting: Emergency Medicine

## 2022-06-10 ENCOUNTER — Other Ambulatory Visit: Payer: Self-pay

## 2022-06-10 ENCOUNTER — Encounter (HOSPITAL_COMMUNITY): Payer: Self-pay

## 2022-06-10 DIAGNOSIS — N644 Mastodynia: Secondary | ICD-10-CM | POA: Diagnosis present

## 2022-06-10 DIAGNOSIS — N611 Abscess of the breast and nipple: Secondary | ICD-10-CM | POA: Insufficient documentation

## 2022-06-10 LAB — I-STAT BETA HCG BLOOD, ED (MC, WL, AP ONLY): I-stat hCG, quantitative: <5 m[IU]/mL (ref <5)

## 2022-06-10 LAB — LACTIC ACID, PLASMA: Lactic Acid, Venous: 1.6 mmol/L (ref 0.5–1.9)

## 2022-06-10 LAB — CBC WITH DIFFERENTIAL/PLATELET
Abs Immature Granulocytes: 0.05 10*3/uL (ref 0.00–0.07)
Basophils Absolute: 0.1 10*3/uL (ref 0.0–0.1)
Basophils Relative: 0 %
Eosinophils Absolute: 0.2 10*3/uL (ref 0.0–0.5)
Eosinophils Relative: 1 %
HCT: 36.8 % (ref 36.0–46.0)
Hemoglobin: 11.3 g/dL — ABNORMAL LOW (ref 12.0–15.0)
Immature Granulocytes: 0 %
Lymphocytes Relative: 17 %
Lymphs Abs: 2 10*3/uL (ref 0.7–4.0)
MCH: 24.9 pg — ABNORMAL LOW (ref 26.0–34.0)
MCHC: 30.7 g/dL (ref 30.0–36.0)
MCV: 81.2 fL (ref 80.0–100.0)
Monocytes Absolute: 0.6 10*3/uL (ref 0.1–1.0)
Monocytes Relative: 5 %
Neutro Abs: 8.9 10*3/uL — ABNORMAL HIGH (ref 1.7–7.7)
Neutrophils Relative %: 77 %
Platelets: 402 10*3/uL — ABNORMAL HIGH (ref 150–400)
RBC: 4.53 MIL/uL (ref 3.87–5.11)
RDW: 15.6 % — ABNORMAL HIGH (ref 11.5–15.5)
WBC: 11.8 10*3/uL — ABNORMAL HIGH (ref 4.0–10.5)
nRBC: 0 % (ref 0.0–0.2)

## 2022-06-10 LAB — COMPREHENSIVE METABOLIC PANEL
ALT: 24 U/L (ref 0–44)
AST: 18 U/L (ref 15–41)
Albumin: 4.2 g/dL (ref 3.5–5.0)
Alkaline Phosphatase: 70 U/L (ref 38–126)
Anion gap: 6 (ref 5–15)
BUN: 10 mg/dL (ref 6–20)
CO2: 26 mmol/L (ref 22–32)
Calcium: 8.8 mg/dL — ABNORMAL LOW (ref 8.9–10.3)
Chloride: 105 mmol/L (ref 98–111)
Creatinine, Ser: 0.47 mg/dL (ref 0.44–1.00)
GFR, Estimated: >60 mL/min (ref >60)
Glucose, Bld: 134 mg/dL — ABNORMAL HIGH (ref 70–99)
Potassium: 3.8 mmol/L (ref 3.5–5.1)
Sodium: 137 mmol/L (ref 135–145)
Total Bilirubin: 0.5 mg/dL (ref 0.3–1.2)
Total Protein: 8.2 g/dL — ABNORMAL HIGH (ref 6.5–8.1)

## 2022-06-10 MED ORDER — LIDOCAINE-EPINEPHRINE (PF) 2 %-1:200000 IJ SOLN
10.0000 mL | Freq: Once | INTRAMUSCULAR | Status: AC
  Administered 2022-06-10: 10 mL
  Filled 2022-06-10: qty 20

## 2022-06-10 MED ORDER — OXYCODONE-ACETAMINOPHEN 5-325 MG PO TABS: 1.0000 | ORAL_TABLET | Freq: Four times a day (QID) | ORAL | 0 refills | Status: AC | PRN

## 2022-06-10 MED ORDER — KETOROLAC TROMETHAMINE 60 MG/2ML IM SOLN
30.0000 mg | Freq: Once | INTRAMUSCULAR | Status: AC
Start: 2022-06-10 — End: 2022-06-10
  Administered 2022-06-10: 30 mg via INTRAMUSCULAR
  Filled 2022-06-10: qty 2

## 2022-06-10 NOTE — ED Triage Notes (Signed)
Pt reports left breast pain x2 weeks. Went to PCP on Friday and was prescribed abt for abscess, went to pcp on Monday due to no improvement and was changed to doxycycline. Pt reports heat to area now. Denies drainage, fever, body aches, chills.

## 2022-06-10 NOTE — ED Provider Triage Note (Signed)
Emergency Medicine Provider Triage Evaluation Note  Wendy Vincent , a 46 y.o. female  was evaluated in triage.  Pt complains of left breast abscess. She had recently had MRI/US of breast in September which showed mass. She says three weeks ago she was seen by breast center for drainage of an abscess. She was prescribed amoxicillin which hasn't helped at all. She says over the past day, the area has become more red and swollen. Denies fevers or chills.  Review of Systems  Positive:  Negative:   Physical Exam  BP 126/84   Pulse 97   Temp 99.2 F (37.3 C)   Resp 18   Ht 5\' 6"  (1.676 m)   Wt 88 kg   LMP 06/07/2022   SpO2 100%   BMI 31.31 kg/m  Gen:   Awake, no distress   Resp:  Normal effort  MSK:   Moves extremities without difficulty  Other:  Left breast with erythematous and warm to touch. Non fluctuant.   Medical Decision Making  Medically screening exam initiated at 9:24 AM.  Appropriate orders placed.  Wendy Vincent was informed that the remainder of the evaluation will be completed by another provider, this initial triage assessment does not replace that evaluation, and the importance of remaining in the ED until their evaluation is complete.     Vesta Mixer, Claudie Leach 06/10/22 [phone]

## 2022-06-10 NOTE — Discharge Instructions (Addendum)
Continue the antibiotics.  Follow up with the breast center.

## 2022-06-10 NOTE — ED Provider Notes (Signed)
[place] DEPT Provider Note   CSN: [id number] Arrival date & time: 06/10/22  [id number]     History  Chief Complaint  Patient presents with   Breast Pain    Wendy Vincent is a 46 y.o. female.  Patient is a 46 year old female presenting today with persistent left breast pain, redness and swelling.  Patient reports symptoms have now been going on for over 3 weeks.  She had had some breast pain for months but more recently in the last 3 weeks she had started getting redness and severe pain.  She went to urgent care who referred her to the breast center did complete a course of Keflex without any improvement and then saw the [facility] on 06/01/2022.  At that time she had 30 mL of cystic abscess looking fluid removed that was negative for malignancy and Gram positive cocci with white blood cells.  Unable to see the culture results.  Patient has been on doxycycline but presents today due to worsening pain and swelling in her right breast.  She denies any systemic symptoms.  She has had no leakage from her breast and is not breast-feeding.  The history is provided by the patient and medical records.       Home Medications Prior to Admission medications   Medication Sig Start Date End Date Taking? Authorizing Provider  oxyCODONE-acetaminophen (PERCOCET/ROXICET) 5-325 MG tablet Take 1 tablet by mouth every 6 (six) hours as needed for severe pain. 06/10/22  Yes Blanchie Dessert, MD  acetaminophen (TYLENOL) 325 MG tablet Take 2 tablets (650 mg total) by mouth every 6 (six) hours as needed for mild pain (temperature > 101.5.). 08/14/19   Fair, Marin Shutter, MD  ibuprofen (ADVIL) 600 MG tablet Take 1 tablet (600 mg total) by mouth every 6 (six) hours as needed. 03/14/22   Talbot Grumbling, FNP  metroNIDAZOLE (FLAGYL) 500 MG tablet Take 1 tablet (500 mg total) by mouth 2 (two) times daily. 06/18/21   Chase Picket, MD  naproxen (NAPROSYN) 375 MG tablet Take  1 tablet (375 mg total) by mouth 2 (two) times daily. 05/16/22   Flossie Dibble, NP  nitrofurantoin, macrocrystal-monohydrate, (MACROBID) 100 MG capsule Take 1 capsule (100 mg total) by mouth 2 (two) times daily. 06/17/21   White, Leitha Schuller, NP  oxyCODONE (OXY IR/ROXICODONE) 5 MG immediate release tablet Take 1-2 tablets (5-10 mg total) by mouth every 4 (four) hours as needed for moderate pain. Patient not taking: Reported on 09/18/2019 08/14/19   Chauncey Mann, MD  Prenatal Vit-Fe Fumarate-FA (PRENATAL MULTIVITAMIN) TABS tablet Take 1 tablet by mouth daily at 12 noon.    [provider]  senna-docusate (SENOKOT-S) 8.6-50 MG tablet Take 2 tablets by mouth daily. Patient not taking: Reported on 09/18/2019 08/15/19   Chauncey Mann, MD      Allergies    Patient has no known allergies.    Review of Systems   Review of Systems  Physical Exam Updated Vital Signs BP 113/65 (BP Location: Left Arm)   Pulse 84   Temp 98.4 F (36.9 C) (Oral)   Resp 16   Ht 5\' 6"  (1.676 m)   Wt 88 kg   LMP 06/07/2022   SpO2 100%   BMI 31.31 kg/m  Physical Exam Vitals and nursing note reviewed. Exam conducted with a chaperone present.  HENT:     Head: Normocephalic.  Cardiovascular:     Rate and Rhythm: Normal rate.  Pulmonary:  Effort: Pulmonary effort is normal.  Chest:  Breasts:    Right: Normal.     Left: Swelling, skin change and tenderness present.    Skin:    General: Skin is warm.  Neurological:     Mental Status: She is alert.  Psychiatric:        Mood and Affect: Mood normal.     ED Results / Procedures / Treatments   Labs (all labs ordered are listed, but only abnormal results are displayed) Labs Reviewed  COMPREHENSIVE METABOLIC PANEL - Abnormal; Notable for the following components:      Result Value   Glucose, Bld 134 (*)    Calcium 8.8 (*)    Total Protein 8.2 (*)    All other components within normal limits  CBC WITH DIFFERENTIAL/PLATELET - Abnormal;  Notable for the following components:   WBC 11.8 (*)    Hemoglobin 11.3 (*)    MCH 24.9 (*)    RDW 15.6 (*)    Platelets 402 (*)    Neutro Abs 8.9 (*)    All other components within normal limits  LACTIC ACID, PLASMA  LACTIC ACID, PLASMA  I-STAT BETA HCG BLOOD, ED ([facility], [facility], [facility] ONLY)    EKG None  Radiology No results found.  Procedures Procedures   INCISION AND DRAINAGE Performed by: Gwyneth Sprout Consent: Verbal consent obtained. Risks and benefits: risks, benefits and alternatives were discussed Type: abscess  Body area: left breast  Anesthesia: local infiltration  Incision was made with a scalpel.  Local anesthetic: lidocaine 2% with epinephrine  Anesthetic total: 4 ml  Complexity: simple Needle aspiration  Drainage: purulent  Drainage amount: 76mL  Packing material: none Patient tolerance: Patient tolerated the procedure well with no immediate complications.   Medications Ordered in ED Medications  lidocaine-EPINEPHrine (XYLOCAINE W/EPI) 2 %-1:200000 (PF) injection 10 mL (10 mLs Infiltration Given 06/10/22 1206)  ketorolac (TORADOL) injection 30 mg (30 mg Intramuscular Given 06/10/22 1202)    ED Course/ Medical Decision Making/ A&P Clinical Course as of 06/10/22 1228  Wed Jun 10, 2022  1001 Comprehensive metabolic panel [GL]    Clinical Course User Index [GL] Claudie Leach, PA-C                           Medical Decision Making Amount and/or Complexity of Data Reviewed External Data Reviewed: notes.    Details: [facility] Labs: ordered. Decision-making details documented in ED Course.  Risk Prescription drug management.   Patient presenting today with what appears to be a breast abscess.  Patient seen by [facility] on 06/01/2022 and at that time had a cystic abscess aspirated 30 cc which was negative for malignancy but multiple gram-positive cocci.  Patient had completed a course of Keflex and reports  she is currently on doxycycline but returned today because symptoms were worsening.  On exam patient's breast is indurated, swollen, tender and erythematous.  She has no nipple discharge.  Bedside aspiration with 45 mL of thick brownish-yellow fluid that appears infectious.  Swelling improved after aspiration.  Instructed patient to continue current antibiotics.  She was given prescription for pain medication and instructed to contact [facility] as she will need follow-up and possible recurrent aspirations.  I independently interpreted patient's labs today with minimal leukocytosis of 11 but otherwise normal CBC and CMP.  No evidence consistent with sepsis at this time.        Final  Clinical Impression(s) / ED Diagnoses Final diagnoses:  Abscess of breast    Rx / DC Orders ED Discharge Orders          Ordered    oxyCODONE-acetaminophen (PERCOCET/ROXICET) 5-325 MG tablet  Every 6 hours PRN        06/10/22 1228              Blanchie Dessert, MD 06/10/22 1228
